# Patient Record
Sex: Male | Born: 1950 | Race: Black or African American | Hispanic: No | Marital: Married | Smoking: Former smoker
Health system: Southern US, Community
[De-identification: ages and names within clinical notes are randomized; demographics above are authoritative.]

## PROBLEM LIST (undated history)

## (undated) DIAGNOSIS — C801 Malignant (primary) neoplasm, unspecified: Secondary | ICD-10-CM

## (undated) DIAGNOSIS — M1990 Unspecified osteoarthritis, unspecified site: Secondary | ICD-10-CM

## (undated) DIAGNOSIS — I10 Essential (primary) hypertension: Secondary | ICD-10-CM

## (undated) DIAGNOSIS — R0600 Dyspnea, unspecified: Secondary | ICD-10-CM

## (undated) DIAGNOSIS — K219 Gastro-esophageal reflux disease without esophagitis: Secondary | ICD-10-CM

## (undated) DIAGNOSIS — G709 Myoneural disorder, unspecified: Secondary | ICD-10-CM

## (undated) DIAGNOSIS — E119 Type 2 diabetes mellitus without complications: Secondary | ICD-10-CM

## (undated) DIAGNOSIS — R112 Nausea with vomiting, unspecified: Secondary | ICD-10-CM

## (undated) DIAGNOSIS — Z87442 Personal history of urinary calculi: Secondary | ICD-10-CM

## (undated) DIAGNOSIS — Z9889 Other specified postprocedural states: Secondary | ICD-10-CM

## (undated) DIAGNOSIS — J45909 Unspecified asthma, uncomplicated: Secondary | ICD-10-CM

## (undated) HISTORY — PX: HERNIA REPAIR: SHX51

## (undated) HISTORY — PX: COLONOSCOPY: SHX174

---

## 2010-04-06 ENCOUNTER — Emergency Department (HOSPITAL_COMMUNITY): Admission: EM | Admit: 2010-04-06 | Discharge: 2010-04-06 | Payer: Self-pay | Admitting: Emergency Medicine

## 2010-12-19 LAB — URINALYSIS, ROUTINE W REFLEX MICROSCOPIC
Bilirubin Urine: NEGATIVE
Glucose, UA: 250 mg/dL — AB
Protein, ur: NEGATIVE mg/dL
Specific Gravity, Urine: 1.01 (ref 1.005–1.030)
pH: 6 (ref 5.0–8.0)

## 2010-12-19 LAB — POCT I-STAT, CHEM 8
BUN: 12 mg/dL (ref 6–23)
Chloride: 105 mEq/L (ref 96–112)
Creatinine, Ser: 1.1 mg/dL (ref 0.4–1.5)
Glucose, Bld: 211 mg/dL — ABNORMAL HIGH (ref 70–99)

## 2010-12-19 LAB — GLUCOSE, CAPILLARY
Glucose-Capillary: 209 mg/dL — ABNORMAL HIGH (ref 70–99)
Glucose-Capillary: 215 mg/dL — ABNORMAL HIGH (ref 70–99)

## 2014-12-15 DIAGNOSIS — N400 Enlarged prostate without lower urinary tract symptoms: Secondary | ICD-10-CM | POA: Diagnosis not present

## 2014-12-15 DIAGNOSIS — E785 Hyperlipidemia, unspecified: Secondary | ICD-10-CM | POA: Diagnosis not present

## 2014-12-15 DIAGNOSIS — E119 Type 2 diabetes mellitus without complications: Secondary | ICD-10-CM | POA: Diagnosis not present

## 2014-12-15 DIAGNOSIS — Z0000 Encounter for general adult medical examination without abnormal findings: Secondary | ICD-10-CM | POA: Diagnosis not present

## 2014-12-15 DIAGNOSIS — E1140 Type 2 diabetes mellitus with diabetic neuropathy, unspecified: Secondary | ICD-10-CM | POA: Diagnosis not present

## 2014-12-15 DIAGNOSIS — Z1212 Encounter for screening for malignant neoplasm of rectum: Secondary | ICD-10-CM | POA: Diagnosis not present

## 2014-12-15 DIAGNOSIS — I10 Essential (primary) hypertension: Secondary | ICD-10-CM | POA: Diagnosis not present

## 2015-03-11 DIAGNOSIS — E119 Type 2 diabetes mellitus without complications: Secondary | ICD-10-CM | POA: Diagnosis not present

## 2015-03-18 DIAGNOSIS — E1140 Type 2 diabetes mellitus with diabetic neuropathy, unspecified: Secondary | ICD-10-CM | POA: Diagnosis not present

## 2015-03-18 DIAGNOSIS — E1165 Type 2 diabetes mellitus with hyperglycemia: Secondary | ICD-10-CM | POA: Diagnosis not present

## 2015-04-23 DIAGNOSIS — Z1211 Encounter for screening for malignant neoplasm of colon: Secondary | ICD-10-CM | POA: Diagnosis not present

## 2015-04-23 DIAGNOSIS — K5900 Constipation, unspecified: Secondary | ICD-10-CM | POA: Diagnosis not present

## 2015-06-17 DIAGNOSIS — E1129 Type 2 diabetes mellitus with other diabetic kidney complication: Secondary | ICD-10-CM | POA: Diagnosis not present

## 2015-06-17 DIAGNOSIS — R809 Proteinuria, unspecified: Secondary | ICD-10-CM | POA: Diagnosis not present

## 2015-06-17 DIAGNOSIS — E1142 Type 2 diabetes mellitus with diabetic polyneuropathy: Secondary | ICD-10-CM | POA: Diagnosis not present

## 2015-06-17 DIAGNOSIS — Z23 Encounter for immunization: Secondary | ICD-10-CM | POA: Diagnosis not present

## 2015-08-12 DIAGNOSIS — E1142 Type 2 diabetes mellitus with diabetic polyneuropathy: Secondary | ICD-10-CM | POA: Diagnosis not present

## 2015-09-16 DIAGNOSIS — E1129 Type 2 diabetes mellitus with other diabetic kidney complication: Secondary | ICD-10-CM | POA: Diagnosis not present

## 2015-09-16 DIAGNOSIS — E1140 Type 2 diabetes mellitus with diabetic neuropathy, unspecified: Secondary | ICD-10-CM | POA: Diagnosis not present

## 2015-09-16 DIAGNOSIS — K047 Periapical abscess without sinus: Secondary | ICD-10-CM | POA: Diagnosis not present

## 2015-09-16 DIAGNOSIS — I10 Essential (primary) hypertension: Secondary | ICD-10-CM | POA: Diagnosis not present

## 2015-12-03 DIAGNOSIS — R05 Cough: Secondary | ICD-10-CM | POA: Diagnosis not present

## 2015-12-03 DIAGNOSIS — M79602 Pain in left arm: Secondary | ICD-10-CM | POA: Diagnosis not present

## 2015-12-03 DIAGNOSIS — E119 Type 2 diabetes mellitus without complications: Secondary | ICD-10-CM | POA: Diagnosis not present

## 2016-01-05 DIAGNOSIS — E1140 Type 2 diabetes mellitus with diabetic neuropathy, unspecified: Secondary | ICD-10-CM | POA: Diagnosis not present

## 2016-01-05 DIAGNOSIS — I10 Essential (primary) hypertension: Secondary | ICD-10-CM | POA: Diagnosis not present

## 2016-01-05 DIAGNOSIS — E1165 Type 2 diabetes mellitus with hyperglycemia: Secondary | ICD-10-CM | POA: Diagnosis not present

## 2016-01-05 DIAGNOSIS — Z125 Encounter for screening for malignant neoplasm of prostate: Secondary | ICD-10-CM | POA: Diagnosis not present

## 2016-01-05 DIAGNOSIS — E7800 Pure hypercholesterolemia, unspecified: Secondary | ICD-10-CM | POA: Diagnosis not present

## 2016-01-05 DIAGNOSIS — Z1389 Encounter for screening for other disorder: Secondary | ICD-10-CM | POA: Diagnosis not present

## 2016-02-18 ENCOUNTER — Emergency Department (HOSPITAL_COMMUNITY)
Admission: EM | Admit: 2016-02-18 | Discharge: 2016-02-18 | Disposition: A | Discharge status: Home / Self Care | Payer: Medicare Other | Attending: Emergency Medicine | Admitting: Emergency Medicine

## 2016-02-18 ENCOUNTER — Emergency Department (HOSPITAL_COMMUNITY): Payer: Medicare Other

## 2016-02-18 ENCOUNTER — Encounter (HOSPITAL_COMMUNITY): Payer: Self-pay | Admitting: Emergency Medicine

## 2016-02-18 DIAGNOSIS — Z7984 Long term (current) use of oral hypoglycemic drugs: Secondary | ICD-10-CM | POA: Diagnosis not present

## 2016-02-18 DIAGNOSIS — N201 Calculus of ureter: Secondary | ICD-10-CM | POA: Diagnosis not present

## 2016-02-18 DIAGNOSIS — E119 Type 2 diabetes mellitus without complications: Secondary | ICD-10-CM | POA: Diagnosis not present

## 2016-02-18 DIAGNOSIS — Z794 Long term (current) use of insulin: Secondary | ICD-10-CM | POA: Insufficient documentation

## 2016-02-18 DIAGNOSIS — N200 Calculus of kidney: Secondary | ICD-10-CM

## 2016-02-18 DIAGNOSIS — N132 Hydronephrosis with renal and ureteral calculous obstruction: Secondary | ICD-10-CM | POA: Diagnosis not present

## 2016-02-18 DIAGNOSIS — R339 Retention of urine, unspecified: Secondary | ICD-10-CM | POA: Diagnosis present

## 2016-02-18 DIAGNOSIS — N202 Calculus of kidney with calculus of ureter: Secondary | ICD-10-CM | POA: Diagnosis not present

## 2016-02-18 HISTORY — DX: Type 2 diabetes mellitus without complications: E11.9

## 2016-02-18 LAB — COMPREHENSIVE METABOLIC PANEL
ALT: 36 U/L (ref 17–63)
ANION GAP: 7 (ref 5–15)
AST: 32 U/L (ref 15–41)
Albumin: 4.3 g/dL (ref 3.5–5.0)
Alkaline Phosphatase: 47 U/L (ref 38–126)
BUN: 18 mg/dL (ref 6–20)
CHLORIDE: 105 mmol/L (ref 101–111)
CO2: 28 mmol/L (ref 22–32)
CREATININE: 1.47 mg/dL — AB (ref 0.61–1.24)
Calcium: 9.7 mg/dL (ref 8.9–10.3)
GFR, EST AFRICAN AMERICAN: 56 mL/min — AB (ref >60)
GFR, EST NON AFRICAN AMERICAN: 48 mL/min — AB (ref >60)
Glucose, Bld: 159 mg/dL — ABNORMAL HIGH (ref 65–99)
POTASSIUM: 4 mmol/L (ref 3.5–5.1)
SODIUM: 140 mmol/L (ref 135–145)
Total Bilirubin: 0.8 mg/dL (ref 0.3–1.2)
Total Protein: 7.4 g/dL (ref 6.5–8.1)

## 2016-02-18 LAB — CBC WITH DIFFERENTIAL/PLATELET
Basophils Absolute: 0 10*3/uL (ref 0.0–0.1)
Basophils Relative: 0 %
EOS ABS: 0 10*3/uL (ref 0.0–0.7)
Eosinophils Relative: 0 %
HEMATOCRIT: 41 % (ref 39.0–52.0)
HEMOGLOBIN: 13.9 g/dL (ref 13.0–17.0)
LYMPHS ABS: 1.2 10*3/uL (ref 0.7–4.0)
Lymphocytes Relative: 10 %
MCH: 29.4 pg (ref 26.0–34.0)
MCHC: 33.9 g/dL (ref 30.0–36.0)
MCV: 86.9 fL (ref 78.0–100.0)
MONOS PCT: 6 %
Monocytes Absolute: 0.7 10*3/uL (ref 0.1–1.0)
NEUTROS ABS: 9.2 10*3/uL — AB (ref 1.7–7.7)
NEUTROS PCT: 84 %
PLATELETS: 185 10*3/uL (ref 150–400)
RBC: 4.72 MIL/uL (ref 4.22–5.81)
RDW: 13.3 % (ref 11.5–15.5)
WBC: 11.1 10*3/uL — AB (ref 4.0–10.5)

## 2016-02-18 LAB — URINE MICROSCOPIC-ADD ON

## 2016-02-18 LAB — URINALYSIS, ROUTINE W REFLEX MICROSCOPIC
Bilirubin Urine: NEGATIVE
GLUCOSE, UA: 100 mg/dL — AB
Hgb urine dipstick: NEGATIVE
KETONES UR: NEGATIVE mg/dL
LEUKOCYTES UA: NEGATIVE
NITRITE: NEGATIVE
PH: 8 (ref 5.0–8.0)
Protein, ur: 100 mg/dL — AB
SPECIFIC GRAVITY, URINE: 1.025 (ref 1.005–1.030)

## 2016-02-18 MED ORDER — OXYCODONE-ACETAMINOPHEN 5-325 MG PO TABS: 1.0000 | ORAL_TABLET | ORAL | Status: DC | PRN

## 2016-02-18 MED ORDER — OXYCODONE-ACETAMINOPHEN 5-325 MG PO TABS
2.0000 | ORAL_TABLET | Freq: Once | ORAL | Status: AC
  Administered 2016-02-18: 2 via ORAL
  Filled 2016-02-18: qty 2

## 2016-02-18 MED ORDER — CIPROFLOXACIN HCL 500 MG PO TABS
500.0000 mg | ORAL_TABLET | Freq: Once | ORAL | Status: AC
  Administered 2016-02-18: 500 mg via ORAL
  Filled 2016-02-18: qty 1

## 2016-02-18 MED ORDER — TAMSULOSIN HCL 0.4 MG PO CAPS: 0.4000 mg | ORAL_CAPSULE | Freq: Every day | ORAL | Status: DC

## 2016-02-18 MED ORDER — CIPROFLOXACIN HCL 500 MG PO TABS: 500.0000 mg | ORAL_TABLET | Freq: Two times a day (BID) | ORAL | Status: DC

## 2016-02-18 MED ORDER — TAMSULOSIN HCL 0.4 MG PO CAPS
0.4000 mg | ORAL_CAPSULE | Freq: Once | ORAL | Status: AC
  Administered 2016-02-18: 0.4 mg via ORAL
  Filled 2016-02-18: qty 1

## 2016-02-18 NOTE — ED Provider Notes (Signed)
CSN: [id number]     Arrival date & time 02/18/16  1520 History   First MD Initiated Contact with Patient 02/18/16 1730     Chief Complaint  Patient presents with  . Urinary Retention     (Consider location/radiation/quality/duration/timing/severity/associated sxs/prior Treatment) HPI Patient reports at approximately 1 PM he was unable to urinate. He reports he developed pain in his left flank and urgency to urinate but could not. He denies he's had this problem before. He states he knew his prostate was somewhat enlarged but has never had to have a catheter before. No recent fever chills or pain or burning with urination. Patient had a urinary catheter placed prior to my evaluation. He rated his initial pain at 8 out of 10 and then after the catheter 4 out of 10. Past Medical History  Diagnosis Date  . Diabetes mellitus without complication ([place])    History reviewed. No pertinent past surgical history. History reviewed. No pertinent family history. Social History  Substance Use Topics  . Smoking status: Never Smoker   . Smokeless tobacco: None  . Alcohol Use: No    Review of Systems  10 Systems reviewed and are negative for acute change except as noted in the HPI.   Allergies  Review of patient's allergies indicates no known allergies.  Home Medications   Prior to Admission medications   Medication Sig Start Date End Date Taking? Authorizing Provider  gabapentin (NEURONTIN) 800 MG tablet Take 800 mg by mouth 3 (three) times daily.   Yes Historical Provider, MD  insulin aspart (NOVOLOG FLEXPEN) 100 UNIT/ML FlexPen Inject 5 Units into the skin 3 (three) times daily with meals.   Yes Historical Provider, MD  LANTUS SOLOSTAR 100 UNIT/ML Solostar Pen INJECT 50-60 UNITS UNDER SKIN TWICE DAILY FOR DIABETES INCREASE DOSE TILL FBS IS 100-130 01/06/16  Yes Historical Provider, MD  lisinopril (PRINIVIL,ZESTRIL) 20 MG tablet Take 20 mg by mouth daily. 12/07/15  Yes Historical Provider, MD   metFORMIN (GLUCOPHAGE) 1000 MG tablet Take 1,000 mg by mouth 2 (two) times daily. 01/07/16  Yes Historical Provider, MD  omeprazole (PRILOSEC) 40 MG capsule Take 40 mg by mouth daily. 01/04/16  Yes Historical Provider, MD  ciprofloxacin (CIPRO) 500 MG tablet Take 1 tablet (500 mg total) by mouth 2 (two) times daily. One po bid x 7 days 02/18/16   Charlesetta Shanks, MD  oxyCODONE-acetaminophen (PERCOCET) 5-325 MG tablet Take 1-2 tablets by mouth every 4 (four) hours as needed. 02/18/16   Charlesetta Shanks, MD  tamsulosin (FLOMAX) 0.4 MG CAPS capsule Take 1 capsule (0.4 mg total) by mouth daily. 02/18/16   Charlesetta Shanks, MD   BP 157/91 mmHg  Pulse 86  Temp(Src) 98.9 F (37.2 C) (Oral)  Resp 19  SpO2 97% Physical Exam  Constitutional: He is oriented to person, place, and time. He appears well-developed and well-nourished.  HENT:  Head: Normocephalic and atraumatic.  Eyes: EOM are normal.  Cardiovascular: Normal rate, regular rhythm, normal heart sounds and intact distal pulses.   Pulmonary/Chest: Effort normal and breath sounds normal.  Abdominal: Soft. He exhibits distension. There is no tenderness.  No CVA tenderness. Patient has central obesity and distended abdomen but it is nontender. Catheter was placed prior to my evaluation.  Musculoskeletal: Normal range of motion. He exhibits no edema or tenderness.  Neurological: He is alert and oriented to person, place, and time. Coordination normal.  Skin: Skin is warm and dry.  Psychiatric: He has a normal mood and affect.  ED Course  Procedures (including critical care time) Labs Review Labs Reviewed  URINALYSIS, ROUTINE W REFLEX MICROSCOPIC (NOT AT [facility]) - Abnormal; Notable for the following:    APPearance CLOUDY (*)    Glucose, UA 100 (*)    Protein, ur 100 (*)    All other components within normal limits  URINE MICROSCOPIC-ADD ON - Abnormal; Notable for the following:    Squamous Epithelial / LPF 0-5 (*)    Bacteria, UA RARE (*)     Casts GRANULAR CAST (*)    All other components within normal limits  COMPREHENSIVE METABOLIC PANEL - Abnormal; Notable for the following:    Glucose, Bld 159 (*)    Creatinine, Ser 1.47 (*)    GFR calc non Af Amer 48 (*)    GFR calc Af Amer 56 (*)    All other components within normal limits  CBC WITH DIFFERENTIAL/PLATELET - Abnormal; Notable for the following:    WBC 11.1 (*)    Neutro Abs 9.2 (*)    All other components within normal limits    Imaging Review Ct Renal Stone Study  02/18/2016  CLINICAL DATA:  65 year old male stop with left flank pain. EXAM: CT ABDOMEN AND PELVIS WITHOUT CONTRAST TECHNIQUE: Multidetector CT imaging of the abdomen and pelvis was performed following the standard protocol without IV contrast. COMPARISON:  None. FINDINGS: Evaluation of this exam is limited in the absence of intravenous contrast. The visualized lung bases are clear. New no intra-abdominal free air or free fluid. The liver, gallbladder, pancreas, spleen, and the adrenal glands appear unremarkable. There is a 2 mm calculus in the distal left ureter adjacent the left UVJ with mild left hydronephrosis. Mild left perinephric stranding noted which may represent edema or related to urine extravasation. No collection. The right kidney, right ureter appear unremarkable. The urinary bladder is decompressed around a Foley catheter. There is diffuse thickening of the bladder wall which may be partly related to underdistention. Cystitis is not excluded. Correlation with urinalysis recommended. The prostate and seminal vesicles are grossly unremarkable. There is moderate stool throughout the colon. No evidence of bowel obstruction or active inflammation. Normal appendix. There is mild aortoiliac atherosclerotic disease. Evaluation of the vasculature is limited on this noncontrast study. There is a retro aortic left renal vein anatomy. No portal venous gas identified. There is no adenopathy. Small bilateral fat  containing inguinal hernias. The abdominal wall soft tissues are otherwise unremarkable. There is degenerative changes of the spine. A Schmorl's node seen at the inferior endplate of the L4 vertebrae. There is disc desiccation with vacuum phenomena at L4-5. No acute fracture. IMPRESSION: A 2 mm distal left ureteral calculus with mild left hydronephrosis. Correlation with urinalysis recommended to exclude superimposed UTI. Electronically Signed   By: Anner Crete M.D.   On: 02/18/2016 21:31   I have personally reviewed and evaluated these images and lab results as part of my medical decision-making.   EKG Interpretation None      MDM   Final diagnoses:  Kidney stone on left side   Immediate Foley output was approximately 250 mL. Total output throughout stay was 450 mL. Findings were not consistent with urinary retention and patient's pain had not alleviated completely as would be typical. CT scan was pursued and patient was found to have a 2 mm left ureteral stone. He is nontoxic and alert. He is afebrile. She has not had vomiting. At this time he will be started on Percocet and Flomax with empiric  ciprofloxacin. Instructions are to follow up with his primary care provider and strain his urine. He is advised to return should he develop worsening pain, fever or other concerning symptoms. Foley catheter was discontinued as this did not appear to be a case of urinary retention.    Charlesetta Shanks, MD 02/18/16 2245

## 2016-02-18 NOTE — ED Notes (Signed)
Pt reports L flank pain and urge to urinate. Last able to urinate at 1230.

## 2016-02-18 NOTE — Discharge Instructions (Signed)
Kidney Stones °Kidney stones (urolithiasis) are deposits that form inside your kidneys. The intense pain is caused by the stone moving through the urinary tract. When the stone moves, the ureter goes into spasm around the stone. The stone is usually passed in the urine.  °CAUSES  °· A disorder that makes certain neck glands produce too much parathyroid hormone (primary hyperparathyroidism). °· A buildup of uric acid crystals, similar to gout in your joints. °· Narrowing (stricture) of the ureter. °· A kidney obstruction present at birth (congenital obstruction). °· Previous surgery on the kidney or ureters. °· Numerous kidney infections. °SYMPTOMS  °· Feeling sick to your stomach (nauseous). °· Throwing up (vomiting). °· Blood in the urine (hematuria). °· Pain that usually spreads (radiates) to the groin. °· Frequency or urgency of urination. °DIAGNOSIS  °· Taking a history and physical exam. °· Blood or urine tests. °· CT scan. °· Occasionally, an examination of the inside of the urinary bladder (cystoscopy) is performed. °TREATMENT  °· Observation. °· Increasing your fluid intake. °· Extracorporeal shock wave lithotripsy--This is a noninvasive procedure that uses shock waves to break up kidney stones. °· Surgery may be needed if you have severe pain or persistent obstruction. There are various surgical procedures. Most of the procedures are performed with the use of small instruments. Only small incisions are needed to accommodate these instruments, so recovery time is minimized. °The size, location, and chemical composition are all important variables that will determine the proper choice of action for you. Talk to your health care provider to better understand your situation so that you will minimize the risk of injury to yourself and your kidney.  °HOME CARE INSTRUCTIONS  °· Drink enough water and fluids to keep your urine clear or pale yellow. This will help you to pass the stone or stone fragments. °· Strain  all urine through the provided strainer. Keep all particulate matter and stones for your health care provider to see. The stone causing the pain may be as small as a grain of salt. It is very important to use the strainer each and every time you pass your urine. The collection of your stone will allow your health care provider to analyze it and verify that a stone has actually passed. The stone analysis will often identify what you can do to reduce the incidence of recurrences. °· Only take over-the-counter or prescription medicines for pain, discomfort, or fever as directed by your health care provider. °· Keep all follow-up visits as told by your health care provider. This is important. °· Get follow-up X-rays if required. The absence of pain does not always mean that the stone has passed. It may have only stopped moving. If the urine remains completely obstructed, it can cause loss of kidney function or even complete destruction of the kidney. It is your responsibility to make sure X-rays and follow-ups are completed. Ultrasounds of the kidney can show blockages and the status of the kidney. Ultrasounds are not associated with any radiation and can be performed easily in a matter of minutes. °· Make changes to your daily diet as told by your health care provider. You may be told to: °¨ Limit the amount of salt that you eat. °¨ Eat 5 or more servings of fruits and vegetables each day. °¨ Limit the amount of meat, poultry, fish, and eggs that you eat. °· Collect a 24-hour urine sample as told by your health care provider. You may need to collect another urine sample every 6-12   months. °SEEK MEDICAL CARE IF: °· You experience pain that is progressive and unresponsive to any pain medicine you have been prescribed. °SEEK IMMEDIATE MEDICAL CARE IF:  °· Pain cannot be controlled with the prescribed medicine. °· You have a fever or shaking chills. °· The severity or intensity of pain increases over 18 hours and is not  relieved by pain medicine. °· You develop a new onset of abdominal pain. °· You feel faint or pass out. °· You are unable to urinate. °  °This information is not intended to replace advice given to you by your health care provider. Make sure you discuss any questions you have with your health care provider. °  °Document Released: 09/19/2005 Document Revised: 06/10/2015 Document Reviewed: 02/20/2013 °[employer] Interactive Patient Education ©2016 [employer] ° °

## 2016-04-07 DIAGNOSIS — S199XXA Unspecified injury of neck, initial encounter: Secondary | ICD-10-CM | POA: Diagnosis not present

## 2016-04-07 DIAGNOSIS — S29019A Strain of muscle and tendon of unspecified wall of thorax, initial encounter: Secondary | ICD-10-CM | POA: Diagnosis not present

## 2016-04-07 DIAGNOSIS — S299XXA Unspecified injury of thorax, initial encounter: Secondary | ICD-10-CM | POA: Diagnosis not present

## 2016-04-07 DIAGNOSIS — M546 Pain in thoracic spine: Secondary | ICD-10-CM | POA: Diagnosis not present

## 2016-04-07 DIAGNOSIS — S161XXA Strain of muscle, fascia and tendon at neck level, initial encounter: Secondary | ICD-10-CM | POA: Diagnosis not present

## 2016-04-07 DIAGNOSIS — M542 Cervicalgia: Secondary | ICD-10-CM | POA: Diagnosis not present

## 2016-04-08 DIAGNOSIS — M546 Pain in thoracic spine: Secondary | ICD-10-CM | POA: Diagnosis not present

## 2016-04-08 DIAGNOSIS — S299XXA Unspecified injury of thorax, initial encounter: Secondary | ICD-10-CM | POA: Diagnosis not present

## 2016-04-08 DIAGNOSIS — S199XXA Unspecified injury of neck, initial encounter: Secondary | ICD-10-CM | POA: Diagnosis not present

## 2016-04-08 DIAGNOSIS — M542 Cervicalgia: Secondary | ICD-10-CM | POA: Diagnosis not present

## 2016-05-04 DIAGNOSIS — Z9181 History of falling: Secondary | ICD-10-CM | POA: Diagnosis not present

## 2016-05-04 DIAGNOSIS — Z0000 Encounter for general adult medical examination without abnormal findings: Secondary | ICD-10-CM | POA: Diagnosis not present

## 2016-05-04 DIAGNOSIS — R972 Elevated prostate specific antigen [PSA]: Secondary | ICD-10-CM | POA: Diagnosis not present

## 2016-05-04 DIAGNOSIS — E1165 Type 2 diabetes mellitus with hyperglycemia: Secondary | ICD-10-CM | POA: Diagnosis not present

## 2016-05-04 DIAGNOSIS — I10 Essential (primary) hypertension: Secondary | ICD-10-CM | POA: Diagnosis not present

## 2016-05-04 DIAGNOSIS — E7800 Pure hypercholesterolemia, unspecified: Secondary | ICD-10-CM | POA: Diagnosis not present

## 2016-09-12 DIAGNOSIS — R972 Elevated prostate specific antigen [PSA]: Secondary | ICD-10-CM | POA: Diagnosis not present

## 2016-09-12 DIAGNOSIS — I10 Essential (primary) hypertension: Secondary | ICD-10-CM | POA: Diagnosis not present

## 2016-09-12 DIAGNOSIS — E7800 Pure hypercholesterolemia, unspecified: Secondary | ICD-10-CM | POA: Diagnosis not present

## 2016-09-12 DIAGNOSIS — E1165 Type 2 diabetes mellitus with hyperglycemia: Secondary | ICD-10-CM | POA: Diagnosis not present

## 2016-09-12 DIAGNOSIS — Z23 Encounter for immunization: Secondary | ICD-10-CM | POA: Diagnosis not present

## 2016-10-06 DIAGNOSIS — N401 Enlarged prostate with lower urinary tract symptoms: Secondary | ICD-10-CM | POA: Diagnosis not present

## 2016-10-06 DIAGNOSIS — Z8042 Family history of malignant neoplasm of prostate: Secondary | ICD-10-CM | POA: Diagnosis not present

## 2016-10-06 DIAGNOSIS — R972 Elevated prostate specific antigen [PSA]: Secondary | ICD-10-CM | POA: Diagnosis not present

## 2016-10-25 DIAGNOSIS — R972 Elevated prostate specific antigen [PSA]: Secondary | ICD-10-CM | POA: Diagnosis not present

## 2016-10-25 DIAGNOSIS — C61 Malignant neoplasm of prostate: Secondary | ICD-10-CM | POA: Diagnosis not present

## 2016-10-25 DIAGNOSIS — Z8042 Family history of malignant neoplasm of prostate: Secondary | ICD-10-CM | POA: Diagnosis not present

## 2016-10-25 DIAGNOSIS — N401 Enlarged prostate with lower urinary tract symptoms: Secondary | ICD-10-CM | POA: Diagnosis not present

## 2016-11-01 DIAGNOSIS — C61 Malignant neoplasm of prostate: Secondary | ICD-10-CM | POA: Diagnosis not present

## 2016-11-07 DIAGNOSIS — D1803 Hemangioma of intra-abdominal structures: Secondary | ICD-10-CM | POA: Diagnosis not present

## 2016-11-07 DIAGNOSIS — R911 Solitary pulmonary nodule: Secondary | ICD-10-CM | POA: Diagnosis not present

## 2016-11-07 DIAGNOSIS — K4020 Bilateral inguinal hernia, without obstruction or gangrene, not specified as recurrent: Secondary | ICD-10-CM | POA: Diagnosis not present

## 2016-11-07 DIAGNOSIS — C61 Malignant neoplasm of prostate: Secondary | ICD-10-CM | POA: Diagnosis not present

## 2016-11-07 DIAGNOSIS — I2510 Atherosclerotic heart disease of native coronary artery without angina pectoris: Secondary | ICD-10-CM | POA: Diagnosis not present

## 2016-11-11 DIAGNOSIS — R05 Cough: Secondary | ICD-10-CM | POA: Diagnosis not present

## 2016-11-22 DIAGNOSIS — C61 Malignant neoplasm of prostate: Secondary | ICD-10-CM | POA: Diagnosis not present

## 2016-12-10 DIAGNOSIS — J209 Acute bronchitis, unspecified: Secondary | ICD-10-CM | POA: Diagnosis not present

## 2016-12-10 DIAGNOSIS — R05 Cough: Secondary | ICD-10-CM | POA: Diagnosis not present

## 2016-12-10 DIAGNOSIS — R0602 Shortness of breath: Secondary | ICD-10-CM | POA: Diagnosis not present

## 2016-12-10 DIAGNOSIS — J440 Chronic obstructive pulmonary disease with acute lower respiratory infection: Secondary | ICD-10-CM | POA: Diagnosis not present

## 2016-12-13 DIAGNOSIS — C61 Malignant neoplasm of prostate: Secondary | ICD-10-CM | POA: Diagnosis not present

## 2016-12-14 ENCOUNTER — Other Ambulatory Visit: Payer: Self-pay | Admitting: Urology

## 2016-12-20 DIAGNOSIS — C61 Malignant neoplasm of prostate: Secondary | ICD-10-CM | POA: Diagnosis not present

## 2016-12-20 DIAGNOSIS — R278 Other lack of coordination: Secondary | ICD-10-CM | POA: Diagnosis not present

## 2016-12-20 DIAGNOSIS — M6281 Muscle weakness (generalized): Secondary | ICD-10-CM | POA: Diagnosis not present

## 2016-12-27 DIAGNOSIS — R319 Hematuria, unspecified: Secondary | ICD-10-CM | POA: Diagnosis not present

## 2016-12-27 DIAGNOSIS — N1330 Unspecified hydronephrosis: Secondary | ICD-10-CM | POA: Diagnosis not present

## 2016-12-27 DIAGNOSIS — R911 Solitary pulmonary nodule: Secondary | ICD-10-CM | POA: Diagnosis not present

## 2016-12-27 DIAGNOSIS — N23 Unspecified renal colic: Secondary | ICD-10-CM | POA: Diagnosis not present

## 2016-12-29 DIAGNOSIS — C61 Malignant neoplasm of prostate: Secondary | ICD-10-CM | POA: Diagnosis not present

## 2016-12-29 DIAGNOSIS — M6281 Muscle weakness (generalized): Secondary | ICD-10-CM | POA: Diagnosis not present

## 2017-01-05 NOTE — Patient Instructions (Addendum)
Joe Thornton  01/05/2017   Your procedure is scheduled on: 01/12/17  Report to [facility] Main  Entrance take [place]  elevators to 3rd floor to  [place] at    [place] AM.  Call this number if you have problems the morning of surgery [phone]   Remember: ONLY 1 PERSON MAY GO WITH YOU TO SHORT STAY TO GET  READY MORNING OF [name].  Do not eat food or drink liquids :After Midnight.     Take these medicines the morning of surgery with A SIP OF WATER: omeprazole  DO NOT TAKE ANY ORAL DIABETIC MEDICATIONS DAY OF YOUR SURGERY                 How to Manage Your Diabetes               Before and After Surgery  Why is it important to control my blood sugar before and after surgery? . Improving blood sugar levels before and after surgery helps healing and can limit problems. . A way of improving blood sugar control is eating a healthy diet by: o  Eating less sugar and carbohydrates o  Increasing activity/exercise o  Talking with your doctor about reaching your blood sugar goals . High blood sugars (greater than 180 mg/dL) can raise your risk of infections and slow your recovery, so you will need to focus on controlling your diabetes during the weeks before surgery. . Make sure that the doctor who takes care of your diabetes knows about your planned surgery including the date and location.  How do I manage my blood sugar before surgery? . Check your blood sugar at least 4 times a day, starting 2 days before surgery, to make sure that the level is not too high or low. o Check your blood sugar the morning of your surgery when you wake up and every 2 hours until you get to the Short Stay unit. . If your blood sugar is less than 70 mg/dL, you will need to treat for low blood sugar: o Do not take insulin. o Treat a low blood sugar (less than 70 mg/dL) with  cup of clear juice (cranberry or apple), 4 glucose tablets, OR glucose gel. o Recheck blood sugar in  15 minutes after treatment (to make sure it is greater than 70 mg/dL). If your blood sugar is not greater than 70 mg/dL on recheck, call [phone] for further instructions. . Report your blood sugar to the short stay nurse when you get to Short Stay.  . If you are admitted to the hospital after surgery: o Your blood sugar will be checked by the staff and you will probably be given insulin after surgery (instead of oral diabetes medicines) to make sure you have good blood sugar levels. o The goal for blood sugar control after surgery is 80-180 mg/dL.   WHAT DO I DO ABOUT MY DIABETES MEDICATION?  Marland Kitchen Do not take oral diabetes medicines (pills) the morning of surgery.  . THE NIGHT BEFORE SURGERY, take  50%   of     Normal Lantus   Insulin dose           Novolog take as usual before meals . NO bedtime dose  . THE MORNING OF SURGERY, take   50%  of Lantus   Insulin dose  . The day of surgery, do not take other  diabetes injectables, including Byetta (exenatide), Bydureon (exenatide ER), Victoza (liraglutide), or Trulicity (dulaglutide).  . If your CBG is greater than 220 mg/dL, you may take  of your sliding scale  . (correction) dose of insulin.     Patient Signature:  Date:   Nurse Signature:  Date:                You may not have any metal on your body including hair pins and              piercings  Do not wear jewelry,lotions, powders or perfumes, deodorant                         Men may shave face and neck.   Do not bring valuables to the hospital. [place].  Contacts, dentures or bridgework may not be worn into surgery.  Leave suitcase in the car. After surgery it may be brought to your room.              [place] - Preparing for Surgery Before surgery, you can play an important role.  Because skin is not sterile, your skin needs to be as free of germs as possible.  You can reduce the number of germs on your skin by  washing with CHG (chlorahexidine gluconate) soap before surgery.  CHG is an antiseptic cleaner which kills germs and bonds with the skin to continue killing germs even after washing. Please DO NOT use if you have an allergy to CHG or antibacterial soaps.  If your skin becomes reddened/irritated stop using the CHG and inform your nurse when you arrive at Short Stay. Do not shave (including legs and underarms) for at least 48 hours prior to the first CHG shower.  You may shave your face/neck. Please follow these instructions carefully:  1.  Shower with CHG Soap the night before surgery and the  morning of Surgery.  2.  If you choose to wash your hair, wash your hair first as usual with your  normal  shampoo.  3.  After you shampoo, rinse your hair and body thoroughly to remove the  shampoo.                           4.  Use CHG as you would any other liquid soap.  You can apply chg directly  to the skin and wash                       Gently with a scrungie or clean washcloth.  5.  Apply the CHG Soap to your body ONLY FROM THE NECK DOWN.   Do not use on face/ open                           Wound or open sores. Avoid contact with eyes, ears mouth and genitals (private parts).                       Wash face,  Genitals (private parts) with your normal soap.             6.  Wash thoroughly, paying special attention to the area where your surgery  will be performed.  7.  Thoroughly rinse  your body with warm water from the neck down.  8.  DO NOT shower/wash with your normal soap after using and rinsing off  the CHG Soap.                9.  Pat yourself dry with a clean towel.            10.  Wear clean pajamas.            11.  Place clean sheets on your bed the night of your first shower and do not  sleep with pets. Day of Surgery : Do not apply any lotions/deodorants the morning of surgery.  Please wear clean clothes to the hospital/surgery center.  FAILURE TO FOLLOW THESE INSTRUCTIONS MAY RESULT IN THE  CANCELLATION OF YOUR SURGERY PATIENT SIGNATURE_________________________________  NURSE SIGNATURE__________________________________  ________________________________________________________________________  WHAT IS A BLOOD TRANSFUSION? Blood Transfusion Information  A transfusion is the replacement of blood or some of its parts. Blood is made up of multiple cells which provide different functions.  Red blood cells carry oxygen and are used for blood loss replacement.  White blood cells fight against infection.  Platelets control bleeding.  Plasma helps clot blood.  Other blood products are available for specialized needs, such as hemophilia or other clotting disorders. BEFORE THE TRANSFUSION  Who gives blood for transfusions?   Healthy volunteers who are fully evaluated to make sure their blood is safe. This is blood bank blood. Transfusion therapy is the safest it has ever been in the practice of medicine. Before blood is taken from a donor, a complete history is taken to make sure that person has no history of diseases nor engages in risky social behavior (examples are intravenous drug use or sexual activity with multiple partners). The donor's travel history is screened to minimize risk of transmitting infections, such as malaria. The donated blood is tested for signs of infectious diseases, such as HIV and hepatitis. The blood is then tested to be sure it is compatible with you in order to minimize the chance of a transfusion reaction. If you or a relative donates blood, this is often done in anticipation of surgery and is not appropriate for emergency situations. It takes many days to process the donated blood. RISKS AND COMPLICATIONS Although transfusion therapy is very safe and saves many lives, the main dangers of transfusion include:   Getting an infectious disease.  Developing a transfusion reaction. This is an allergic reaction to something in the blood you were given. Every  precaution is taken to prevent this. The decision to have a blood transfusion has been considered carefully by your caregiver before blood is given. Blood is not given unless the benefits outweigh the risks. AFTER THE TRANSFUSION  Right after receiving a blood transfusion, you will usually feel much better and more energetic. This is especially true if your red blood cells have gotten low (anemic). The transfusion raises the level of the red blood cells which carry oxygen, and this usually causes an energy increase.  The nurse administering the transfusion will monitor you carefully for complications. HOME CARE INSTRUCTIONS  No special instructions are needed after a transfusion. You may find your energy is better. Speak with your caregiver about any limitations on activity for underlying diseases you may have. SEEK MEDICAL CARE IF:   Your condition is not improving after your transfusion.  You develop redness or irritation at the intravenous (IV) site. SEEK IMMEDIATE MEDICAL CARE IF:  Any of the following  symptoms occur over the next 12 hours:  Shaking chills.  You have a temperature by mouth above 102 F (38.9 C), not controlled by medicine.  Chest, back, or muscle pain.  People around you feel you are not acting correctly or are confused.  Shortness of breath or difficulty breathing.  Dizziness and fainting.  You get a rash or develop hives.  You have a decrease in urine output.  Your urine turns a dark color or changes to pink, red, or brown. Any of the following symptoms occur over the next 10 days:  You have a temperature by mouth above 102 F (38.9 C), not controlled by medicine.  Shortness of breath.  Weakness after normal activity.  The white part of the eye turns yellow (jaundice).  You have a decrease in the amount of urine or are urinating less often.  Your urine turns a dark color or changes to pink, red, or brown. Document Released: 09/16/2000 Document  Revised: 12/12/2011 Document Reviewed: 05/05/2008 ExitCare Patient Information 2014 [place].  _______________________________________________________________________  Incentive Spirometer  An incentive spirometer is a tool that can help keep your lungs clear and active. This tool measures how well you are filling your lungs with each breath. Taking long deep breaths may help reverse or decrease the chance of developing breathing (pulmonary) problems (especially infection) following:  A long period of time when you are unable to move or be active. BEFORE THE PROCEDURE   If the spirometer includes an indicator to show your best effort, your nurse or respiratory therapist will set it to a desired goal.  If possible, sit up straight or lean slightly forward. Try not to slouch.  Hold the incentive spirometer in an upright position. INSTRUCTIONS FOR USE  1. Sit on the edge of your bed if possible, or sit up as far as you can in bed or on a chair. 2. Hold the incentive spirometer in an upright position. 3. Breathe out normally. 4. Place the mouthpiece in your mouth and seal your lips tightly around it. 5. Breathe in slowly and as deeply as possible, raising the piston or the ball toward the top of the column. 6. Hold your breath for 3-5 seconds or for as long as possible. Allow the piston or ball to fall to the bottom of the column. 7. Remove the mouthpiece from your mouth and breathe out normally. 8. Rest for a few seconds and repeat Steps 1 through 7 at least 10 times every 1-2 hours when you are awake. Take your time and take a few normal breaths between deep breaths. 9. The spirometer may include an indicator to show your best effort. Use the indicator as a goal to work toward during each repetition. 10. After each set of 10 deep breaths, practice coughing to be sure your lungs are clear. If you have an incision (the cut made at the time of surgery), support your incision when  coughing by placing a pillow or rolled up towels firmly against it. Once you are able to get out of bed, walk around indoors and cough well. You may stop using the incentive spirometer when instructed by your caregiver.  RISKS AND COMPLICATIONS  Take your time so you do not get dizzy or light-headed.  If you are in pain, you may need to take or ask for pain medication before doing incentive spirometry. It is harder to take a deep breath if you are having pain. AFTER USE  Rest and breathe slowly and easily.  It can be helpful to keep track of a log of your progress. Your caregiver can provide you with a simple table to help with this. If you are using the spirometer at home, follow these instructions: [place] IF:   You are having difficultly using the spirometer.  You have trouble using the spirometer as often as instructed.  Your pain medication is not giving enough relief while using the spirometer.  You develop fever of 100.5 F (38.1 C) or higher. SEEK IMMEDIATE MEDICAL CARE IF:   You cough up bloody sputum that had not been present before.  You develop fever of 102 F (38.9 C) or greater.  You develop worsening pain at or near the incision site. MAKE SURE YOU:   Understand these instructions.  Will watch your condition.  Will get help right away if you are not doing well or get worse. Document Released: 01/30/2007 Document Revised: 12/12/2011 Document Reviewed: 04/02/2007 ExitCare Patient Information 2014 ExitCare, [place].   ________________________________________________________________________    CLEAR LIQUID DIET   Foods Allowed                                                                     Foods Excluded  Coffee and tea, regular and decaf                             liquids that you cannot  Plain Jell-O in any flavor                                             see through such as: Fruit ices (not with fruit pulp)                                      milk, soups, orange juice  Iced Popsicles                                    All solid food Carbonated beverages, regular and diet                                    Cranberry, grape and apple juices Sports drinks like Gatorade Lightly seasoned clear broth or consume(fat free) Sugar, honey syrup  Sample Menu Breakfast                                Lunch                                     Supper Cranberry juice                    Beef broth  Chicken broth Jell-O                                     Grape juice                           Apple juice Coffee or tea                        Jell-O                                      Popsicle                                                Coffee or tea                        Coffee or tea  _____________________________________________________________________

## 2017-01-09 ENCOUNTER — Encounter (HOSPITAL_COMMUNITY)
Admission: RE | Admit: 2017-01-09 | Discharge: 2017-01-09 | Disposition: A | Discharge status: Home / Self Care | Payer: Medicare Other | Source: Ambulatory Visit | Attending: Urology | Admitting: Urology

## 2017-01-09 ENCOUNTER — Encounter (INDEPENDENT_AMBULATORY_CARE_PROVIDER_SITE_OTHER): Payer: Self-pay

## 2017-01-09 ENCOUNTER — Encounter (HOSPITAL_COMMUNITY): Payer: Self-pay

## 2017-01-09 DIAGNOSIS — E7800 Pure hypercholesterolemia, unspecified: Secondary | ICD-10-CM | POA: Diagnosis not present

## 2017-01-09 DIAGNOSIS — Z01810 Encounter for preprocedural cardiovascular examination: Secondary | ICD-10-CM

## 2017-01-09 DIAGNOSIS — I10 Essential (primary) hypertension: Secondary | ICD-10-CM | POA: Diagnosis not present

## 2017-01-09 DIAGNOSIS — M1990 Unspecified osteoarthritis, unspecified site: Secondary | ICD-10-CM | POA: Diagnosis not present

## 2017-01-09 DIAGNOSIS — E1142 Type 2 diabetes mellitus with diabetic polyneuropathy: Secondary | ICD-10-CM | POA: Diagnosis not present

## 2017-01-09 DIAGNOSIS — Z794 Long term (current) use of insulin: Secondary | ICD-10-CM | POA: Diagnosis not present

## 2017-01-09 DIAGNOSIS — I440 Atrioventricular block, first degree: Secondary | ICD-10-CM

## 2017-01-09 DIAGNOSIS — Z01818 Encounter for other preprocedural examination: Secondary | ICD-10-CM | POA: Insufficient documentation

## 2017-01-09 DIAGNOSIS — C61 Malignant neoplasm of prostate: Secondary | ICD-10-CM | POA: Insufficient documentation

## 2017-01-09 DIAGNOSIS — K219 Gastro-esophageal reflux disease without esophagitis: Secondary | ICD-10-CM | POA: Diagnosis not present

## 2017-01-09 DIAGNOSIS — Z87891 Personal history of nicotine dependence: Secondary | ICD-10-CM | POA: Diagnosis not present

## 2017-01-09 DIAGNOSIS — J45909 Unspecified asthma, uncomplicated: Secondary | ICD-10-CM | POA: Diagnosis not present

## 2017-01-09 HISTORY — DX: Malignant (primary) neoplasm, unspecified: C80.1

## 2017-01-09 HISTORY — DX: Unspecified osteoarthritis, unspecified site: M19.90

## 2017-01-09 HISTORY — DX: Nausea with vomiting, unspecified: R11.2

## 2017-01-09 HISTORY — DX: Other specified postprocedural states: Z98.890

## 2017-01-09 HISTORY — DX: Gastro-esophageal reflux disease without esophagitis: K21.9

## 2017-01-09 HISTORY — DX: Unspecified asthma, uncomplicated: J45.909

## 2017-01-09 HISTORY — DX: Dyspnea, unspecified: R06.00

## 2017-01-09 HISTORY — DX: Essential (primary) hypertension: I10

## 2017-01-09 HISTORY — DX: Personal history of urinary calculi: Z87.442

## 2017-01-09 HISTORY — DX: Myoneural disorder, unspecified: G70.9

## 2017-01-09 LAB — CBC
HCT: 41 % (ref 39.0–52.0)
Hemoglobin: 13.7 g/dL (ref 13.0–17.0)
MCH: 28.7 pg (ref 26.0–34.0)
MCHC: 33.4 g/dL (ref 30.0–36.0)
MCV: 85.8 fL (ref 78.0–100.0)
Platelets: 191 K/uL (ref 150–400)
RBC: 4.78 MIL/uL (ref 4.22–5.81)
RDW: 13.4 % (ref 11.5–15.5)
WBC: 7.1 K/uL (ref 4.0–10.5)

## 2017-01-09 LAB — BASIC METABOLIC PANEL WITH GFR
Anion gap: 6 (ref 5–15)
BUN: 12 mg/dL (ref 6–20)
CO2: 28 mmol/L (ref 22–32)
Calcium: 9.7 mg/dL (ref 8.9–10.3)
Chloride: 107 mmol/L (ref 101–111)
Creatinine, Ser: 1.06 mg/dL (ref 0.61–1.24)
GFR calc Af Amer: >60 mL/min
GFR calc non Af Amer: >60 mL/min
Glucose, Bld: 93 mg/dL (ref 65–99)
Potassium: 3.6 mmol/L (ref 3.5–5.1)
Sodium: 141 mmol/L (ref 135–145)

## 2017-01-09 LAB — GLUCOSE, CAPILLARY: Glucose-Capillary: 91 mg/dL (ref 65–99)

## 2017-01-09 LAB — ABO/RH: ABO/RH(D): O POS

## 2017-01-09 NOTE — Progress Notes (Signed)
ekg 01/09/17 final epic

## 2017-01-09 NOTE — Progress Notes (Signed)
CXR 3/18  LOV 11/11/16 Labs 09/2016  EKG 12/2015 EKG 01/09/17  All on chart

## 2017-01-10 LAB — HEMOGLOBIN A1C
Hgb A1c MFr Bld: 6.6 % — ABNORMAL HIGH (ref 4.8–5.6)
MEAN PLASMA GLUCOSE: 143 mg/dL

## 2017-01-11 ENCOUNTER — Encounter (HOSPITAL_COMMUNITY): Payer: Self-pay | Admitting: Anesthesiology

## 2017-01-11 NOTE — H&P (Signed)
Office Visit Report     12/13/2016   --------------------------------------------------------------------------------   Joe Thornton. Joe Thornton  MRN: [id number]  PRIMARY CARE:  Cyndi Bender, [place]  DOB: Jul 04, 1951, 66 year old Male  REFERRING:  Joie Bimler, MD  SSN: -[id number]  PROVIDER:  Raynelle Bring, M.D.    LOCATION:  [facility] [phone]   --------------------------------------------------------------------------------   CC/HPI: CC: Prostate Cancer   Physician requesting consult: Dr. Joie Bimler  PCP: Cyndi Bender, PA-C   Joe Thornton is a 66 year old gentleman who was found to have an elevated PSA of 5.4 prompting a TRUS biopsy of the prostate on 10/25/16. This demonstrated Gleason 4+3=7 adenocarcinoma of the prostate with 12 out of 12 biopsy cores positive for malignancy.   Family history: He has a father who died of metastatic disease. He has a brother who was treated with surgical therapy.   Imaging studies:  CT of the abdomen and pelvis (11/03/16) -- 5 mm right lung base nodule stable since May. No other findings to suggest metastatic disease.  Bone scan (2/1/8) - Increased uptake at L5 and the bilateral sacroiliac joints, shoulders, knees, and ankles felt to most likely represent degenerative/arthritic changes without findings to strongly suggest metastatic disease.   PMH: He has a history of insulin-dependent diabetes, GERD, hypertension, hypercholesterolemia, and asthma.  PSH: Bilateral open inguinal hernia repairs.   TNM stage: cT1c N0 M0  PSA: 5.4  Gleason score: 4+3=7  Biopsy (10/25/16 - ready by Dr. Haywood Lasso, Acc # [id number], [facility]): /12 cores positive  Left: L lateral apex (90%, 4+3=7), L apex (90%, 3+4=7), L lateral mid (10%, 4+3=7), L mid (20%, 3+4=7), L lateral base (25%, 3+4=7), L base (20%, 3+4=7)  Right: R apex (50%, 3+4=7), R lateral apex (75%, 3+3=6), R mid (30%, 3+3=6), R lateral mid (30%, 3+3=6), R base (20%, 3+3=6), R  lateral base (30%, 3+3=6)  Prostate volume: 42.7 cc   Nomogram  OC disease: 40%  EPE: 58%  SVI: 7%  LNI: 7%  PFS (5 year, 10 year): 54%,38%   Urinary function: IPSS is 10.  Erectile function: SHIM score is 1. He has long-standing erectile dysfunction.     ALLERGIES: None   MEDICATIONS: Finasteride  Lisinopril  Metformin Hcl  Omeprazole  Lantus Solostar  Novolog  Rosuvastatin Calcium     GU PSH: None   NON-GU PSH: Hernia Repair    GU PMH: None   NON-GU PMH: Arthritis Diabetes Type 2 GERD Hypercholesterolemia Hypertension    FAMILY HISTORY: 1 son - Runs in Family 3 daughters - Runs in Family Bone Cancer - Father Heart Attack - Mother   SOCIAL HISTORY: Marital Status: Married Current Smoking Status: Patient smokes.   Tobacco Use Assessment Completed: Used Tobacco in last 30 days? Does not drink anymore.  Drinks 3 caffeinated drinks per day.    REVIEW OF SYSTEMS:    GU Review Male:   weak stream. Patient reports frequent urination, hard to postpone urination, get up at night to urinate, and leakage of urine. Patient denies burning/ pain with urination, stream starts and stops, trouble starting your streams, and have to strain to urinate .  Gastrointestinal (Upper):   Patient denies nausea and vomiting.  Gastrointestinal (Lower):   Patient reports constipation. Patient denies diarrhea.  Constitutional:   Patient reports weight loss. Patient denies fever, night sweats, and fatigue.  Skin:   Patient reports itching. Patient denies skin rash/ lesion.  Eyes:   Patient denies blurred vision  and double vision.  Ears/ Nose/ Throat:   Patient reports sinus problems. Patient denies sore throat.  Hematologic/Lymphatic:   Patient denies swollen glands and easy bruising.  Cardiovascular:   Patient denies leg swelling and chest pains.  Respiratory:   Patient reports cough. Patient denies shortness of breath.  Endocrine:   Patient reports excessive thirst.    Musculoskeletal:   Patient denies back pain and joint pain.  Neurological:   Patient denies headaches and dizziness.  Psychologic:   Patient denies depression and anxiety.   VITAL SIGNS:      12/13/2016 10:41 AM  Weight 210 lb / 95.25 kg  Height 69 in / 175.26 cm  BP 133/83 mmHg  Pulse 83 /min  BMI 31.0 kg/m   GU PHYSICAL EXAMINATION:    Prostate: Prostate about 50 grams. He does have mild induration of the right lateral aspect of the prostate.   MULTI-SYSTEM PHYSICAL EXAMINATION:    Constitutional: Well-nourished. No physical deformities. Normally developed. Good grooming.  Neck: Neck symmetrical, not swollen. Normal tracheal position.  Respiratory: No labored breathing, no use of accessory muscles. Clear bilaterally.  Cardiovascular: Normal temperature, normal extremity pulses, no swelling, no varicosities. Regular rate and rhythm.  Lymphatic: No enlargement of neck, axillae, groin.  Skin: No paleness, no jaundice, no cyanosis. No lesion, no ulcer, no rash.  Neurologic / Psychiatric: Oriented to time, oriented to place, oriented to person. No depression, no anxiety, no agitation.  Gastrointestinal: No mass, no tenderness, no rigidity. Mildly obese.  Eyes: Normal conjunctivae. Normal eyelids.  Ears, Nose, Mouth, and Throat: Left ear no scars, no lesions, no masses. Right ear no scars, no lesions, no masses. Nose no scars, no lesions, no masses. Normal hearing. Normal lips.  Musculoskeletal: Normal gait and station of head and neck.     PAST DATA REVIEWED:  Source Of History:  Patient  Lab Test Review:   PSA  Records Review:   Pathology Reports, Previous Patient Records  Urine Test Review:   Urinalysis  X-Ray Review: C.T. Abdomen/Pelvis: Reviewed Films. Findings as dictated above. Bone Scan: Reviewed Films. Findings as dictated above.    PROCEDURES:          Urinalysis w/Scope Dipstick Dipstick Cont'd Micro  Color: Yellow Bilirubin: Neg WBC/hpf: 0 - 5/hpf  Appearance:  Clear Ketones: Neg RBC/hpf: 0 - 2/hpf  Specific Gravity: 1.025 Blood: 2+ Bacteria: NS (Not Seen)  pH: 6.5 Protein: 2+ Cystals: NS (Not Seen)  Glucose: Neg Urobilinogen: 1.0 Casts: NS (Not Seen)    Nitrites: Neg Trichomonas: Not Present    Leukocyte Esterase: Neg Mucous: Not Present      Epithelial Cells: 0 - 5/hpf      Yeast: NS (Not Seen)      Sperm: Not Present    ASSESSMENT:      ICD-10 Details  1 GU:   Prostate Cancer - C61    PLAN:           Schedule Return Visit/Planned Activity: Other See Visit Notes             Note: Will call to schedule surgery.  Return Visit/Planned Activity: Next Available Appointment - PT/OT Referral             Note: Please schedule patient for preoperative PT prior to radical prostatectomy.            Document Letter(s):  Created for Patient: Clinical Summary         Notes:   1. Prostate cancer: I  had a detailed discussion with Joe Thornton, his wife, and his sister today regarding his prostate cancer diagnosis and options for management. I did recommend therapy of curative intent based on his disease parameters and life expectancy. We discussed that primary surgical therapy as well as radiation therapy with short-term androgen deprivation as reasonable options.   The patient was counseled about the natural history of prostate cancer and the standard treatment options that are available for prostate cancer. It was explained to him how his age and life expectancy, clinical stage, Gleason score, and PSA affect his prognosis, the decision to proceed with additional staging studies, as well as how that information influences recommended treatment strategies. We discussed the roles for active surveillance, radiation therapy, surgical therapy, androgen deprivation, as well as ablative therapy options for the treatment of prostate cancer as appropriate to his individual cancer situation. We discussed the risks and benefits of these options with regard to their  impact on cancer control and also in terms of potential adverse events, complications, and impact on quality of life particularly related to urinary and sexual function. The patient was encouraged to ask questions throughout the discussion today and all questions were answered to his stated satisfaction. In addition, the patient was provided with and/or directed to appropriate resources and literature for further education about prostate cancer and treatment options.   We discussed surgical therapy for prostate cancer including the different available surgical approaches. We discussed, in detail, the risks and expectations of surgery with regard to cancer control, urinary control, and erectile function as well as the expected postoperative recovery process. Additional risks of surgery including but not limited to bleeding, infection, hernia formation, nerve damage, lymphocele formation, bowel/rectal injury potentially necessitating colostomy, damage to the urinary tract resulting in urine leakage, urethral stricture, and the cardiopulmonary risks such as myocardial infarction, stroke, death, venothromboembolism, etc. were explained. The risk of open surgical conversion for robotic/laparoscopic prostatectomy was also discussed.   After discussion, he does adamantly wish to proceed with primary surgical therapy and declines a radiation oncology consultation. He will be scheduled for a non-nerve sparing robot-assisted laparoscopic radical prostatectomy and pelvic lymphadenectomy.   CC: Cyndi Bender, PA-C  Dr. Joie Bimler          E & M CODE: I spent at least 60 minutes face to face with the patient, more than 50% of that time was spent on counseling and/or coordinating care.     * Signed by Raynelle Bring, M.D. on 12/13/16 at 7:22 PM (EDT)*

## 2017-01-11 NOTE — Anesthesia Preprocedure Evaluation (Addendum)
Anesthesia Evaluation  Patient identified by MRN, date of birth, ID band Patient awake    Reviewed: Allergy & Precautions, NPO status , Patient's Chart, lab work & pertinent test results  History of Anesthesia Complications (+) PONV and history of anesthetic complications  Airway Mallampati: II  TM Distance: >3 FB Neck ROM: Full    Dental  (+) Edentulous Upper, Edentulous Lower   Pulmonary shortness of breath and with exertion, asthma , former smoker,    Pulmonary exam normal breath sounds clear to auscultation       Cardiovascular hypertension, Pt. on medications Normal cardiovascular exam Rhythm:Regular Rate:Normal     Neuro/Psych Peripheral neuropathy  Neuromuscular disease negative psych ROS   GI/Hepatic GERD  Medicated and Controlled,  Endo/Other  diabetes, Well Controlled, Type 2, Insulin Dependent  Renal/GU    Prostate Ca    Musculoskeletal  (+) Arthritis , Osteoarthritis,    Abdominal   Peds  Hematology   Anesthesia Other Findings   Reproductive/Obstetrics                            Anesthesia Physical Anesthesia Plan  ASA: III  Anesthesia Plan: General   Post-op Pain Management:    Induction: Intravenous  Airway Management Planned: Oral ETT  Additional Equipment:   Intra-op Plan:   Post-operative Plan: Extubation in OR  Informed Consent: I have reviewed the patients History and Physical, chart, labs and discussed the procedure including the risks, benefits and alternatives for the proposed anesthesia with the patient or authorized representative who has indicated his/her understanding and acceptance.     Plan Discussed with: Anesthesiologist, CRNA and Surgeon  Anesthesia Plan Comments:        Anesthesia Quick Evaluation

## 2017-01-12 ENCOUNTER — Observation Stay (HOSPITAL_COMMUNITY)
Admission: AD | Admit: 2017-01-12 | Discharge: 2017-01-13 | Disposition: A | Discharge status: Home / Self Care | Payer: Medicare Other | Source: Ambulatory Visit | Attending: Urology | Admitting: Urology

## 2017-01-12 ENCOUNTER — Encounter (HOSPITAL_COMMUNITY): Payer: Self-pay | Admitting: *Deleted

## 2017-01-12 ENCOUNTER — Ambulatory Visit (HOSPITAL_COMMUNITY): Payer: Medicare Other | Admitting: Certified Registered Nurse Anesthetist

## 2017-01-12 ENCOUNTER — Encounter (HOSPITAL_COMMUNITY)
Admission: AD | Disposition: A | Discharge status: Home / Self Care | Payer: Self-pay | Source: Ambulatory Visit | Attending: Urology

## 2017-01-12 DIAGNOSIS — Z794 Long term (current) use of insulin: Secondary | ICD-10-CM | POA: Insufficient documentation

## 2017-01-12 DIAGNOSIS — E1142 Type 2 diabetes mellitus with diabetic polyneuropathy: Secondary | ICD-10-CM | POA: Insufficient documentation

## 2017-01-12 DIAGNOSIS — K219 Gastro-esophageal reflux disease without esophagitis: Secondary | ICD-10-CM | POA: Diagnosis not present

## 2017-01-12 DIAGNOSIS — I10 Essential (primary) hypertension: Secondary | ICD-10-CM | POA: Diagnosis not present

## 2017-01-12 DIAGNOSIS — Z87891 Personal history of nicotine dependence: Secondary | ICD-10-CM | POA: Diagnosis not present

## 2017-01-12 DIAGNOSIS — E7800 Pure hypercholesterolemia, unspecified: Secondary | ICD-10-CM | POA: Insufficient documentation

## 2017-01-12 DIAGNOSIS — E119 Type 2 diabetes mellitus without complications: Secondary | ICD-10-CM | POA: Diagnosis not present

## 2017-01-12 DIAGNOSIS — J45909 Unspecified asthma, uncomplicated: Secondary | ICD-10-CM | POA: Diagnosis not present

## 2017-01-12 DIAGNOSIS — C61 Malignant neoplasm of prostate: Principal | ICD-10-CM | POA: Diagnosis present

## 2017-01-12 DIAGNOSIS — M1990 Unspecified osteoarthritis, unspecified site: Secondary | ICD-10-CM | POA: Insufficient documentation

## 2017-01-12 HISTORY — PX: PELVIC LYMPH NODE DISSECTION: SHX6543

## 2017-01-12 HISTORY — PX: ROBOT ASSISTED LAPAROSCOPIC RADICAL PROSTATECTOMY: SHX5141

## 2017-01-12 LAB — GLUCOSE, CAPILLARY
GLUCOSE-CAPILLARY: 77 mg/dL (ref 65–99)
GLUCOSE-CAPILLARY: 119 mg/dL — AB (ref 65–99)
GLUCOSE-CAPILLARY: 252 mg/dL — AB (ref 65–99)
GLUCOSE-CAPILLARY: 96 mg/dL (ref 65–99)

## 2017-01-12 LAB — TYPE AND SCREEN
ABO/RH(D): O POS
Antibody Screen: NEGATIVE

## 2017-01-12 LAB — HEMOGLOBIN AND HEMATOCRIT, BLOOD
HCT: 39.2 % (ref 39.0–52.0)
Hemoglobin: 13 g/dL (ref 13.0–17.0)

## 2017-01-12 SURGERY — XI ROBOTIC ASSISTED LAPAROSCOPIC RADICAL PROSTATECTOMY LEVEL 2
Anesthesia: General | Site: Abdomen

## 2017-01-12 MED ORDER — ONDANSETRON HCL 4 MG/2ML IJ SOLN
INTRAMUSCULAR | Status: DC | PRN
  Administered 2017-01-12: 8 mg via INTRAVENOUS

## 2017-01-12 MED ORDER — INSULIN ASPART 100 UNIT/ML ~~LOC~~ SOLN
0.0000 [IU] | Freq: Three times a day (TID) | SUBCUTANEOUS | Status: DC
  Administered 2017-01-12: 5 [IU] via SUBCUTANEOUS

## 2017-01-12 MED ORDER — CEFAZOLIN IN D5W 1 GM/50ML IV SOLN
1.0000 g | Freq: Three times a day (TID) | INTRAVENOUS | Status: AC
  Administered 2017-01-12 (×2): 1 g via INTRAVENOUS
  Filled 2017-01-12 (×2): qty 50

## 2017-01-12 MED ORDER — SULFAMETHOXAZOLE-TRIMETHOPRIM 800-160 MG PO TABS: 1.0000 | ORAL_TABLET | Freq: Two times a day (BID) | ORAL | 0 refills | Status: AC

## 2017-01-12 MED ORDER — PROMETHAZINE HCL 25 MG/ML IJ SOLN: 6.2500 mg | INTRAMUSCULAR | Status: DC | PRN

## 2017-01-12 MED ORDER — MEPERIDINE HCL 50 MG/ML IJ SOLN: 6.2500 mg | INTRAMUSCULAR | Status: DC | PRN

## 2017-01-12 MED ORDER — HYDROMORPHONE HCL 1 MG/ML IJ SOLN
INTRAMUSCULAR | Status: DC | PRN
  Administered 2017-01-12 (×2): 0.5 mg via INTRAVENOUS

## 2017-01-12 MED ORDER — FENTANYL CITRATE (PF) 100 MCG/2ML IJ SOLN
INTRAMUSCULAR | Status: DC | PRN
  Administered 2017-01-12 (×5): 50 ug via INTRAVENOUS

## 2017-01-12 MED ORDER — KETOROLAC TROMETHAMINE 15 MG/ML IJ SOLN
15.0000 mg | Freq: Four times a day (QID) | INTRAMUSCULAR | Status: DC
  Administered 2017-01-12 – 2017-01-13 (×5): 15 mg via INTRAVENOUS
  Filled 2017-01-12 (×5): qty 1

## 2017-01-12 MED ORDER — PHENYLEPHRINE HCL 10 MG/ML IJ SOLN
INTRAMUSCULAR | Status: DC | PRN
  Administered 2017-01-12: 80 ug via INTRAVENOUS

## 2017-01-12 MED ORDER — OXYCODONE HCL 5 MG PO TABS
5.0000 mg | ORAL_TABLET | ORAL | Status: DC | PRN
  Administered 2017-01-12: 5 mg via ORAL
  Filled 2017-01-12 (×2): qty 1

## 2017-01-12 MED ORDER — HYDROMORPHONE HCL 2 MG/ML IJ SOLN
0.2500 mg | INTRAMUSCULAR | Status: DC | PRN
  Administered 2017-01-12 (×2): 0.5 mg via INTRAVENOUS

## 2017-01-12 MED ORDER — LACTATED RINGERS IV SOLN
INTRAVENOUS | Status: DC | PRN
Start: 2017-01-12 — End: 2017-01-12
  Administered 2017-01-12 (×2): via INTRAVENOUS

## 2017-01-12 MED ORDER — BUPIVACAINE-EPINEPHRINE 0.25% -1:200000 IJ SOLN
INTRAMUSCULAR | Status: DC | PRN
  Administered 2017-01-12: 30 mL

## 2017-01-12 MED ORDER — PANTOPRAZOLE SODIUM 40 MG PO TBEC
80.0000 mg | DELAYED_RELEASE_TABLET | Freq: Every day | ORAL | Status: DC
  Administered 2017-01-13: 80 mg via ORAL
  Filled 2017-01-12: qty 2

## 2017-01-12 MED ORDER — DOCUSATE SODIUM 100 MG PO CAPS
100.0000 mg | ORAL_CAPSULE | Freq: Two times a day (BID) | ORAL | Status: DC
  Administered 2017-01-12 – 2017-01-13 (×2): 100 mg via ORAL
  Filled 2017-01-12 (×2): qty 1

## 2017-01-12 MED ORDER — HEPARIN SODIUM (PORCINE) 1000 UNIT/ML IJ SOLN
INTRAMUSCULAR | Status: AC
  Filled 2017-01-12: qty 1

## 2017-01-12 MED ORDER — MORPHINE SULFATE (PF) 2 MG/ML IV SOLN
2.0000 mg | INTRAVENOUS | Status: DC | PRN
  Administered 2017-01-12: 2 mg via INTRAVENOUS
  Filled 2017-01-12: qty 2

## 2017-01-12 MED ORDER — KCL IN DEXTROSE-NACL 20-5-0.45 MEQ/L-%-% IV SOLN
INTRAVENOUS | Status: DC
  Administered 2017-01-12: 14:00:00 via INTRAVENOUS
  Filled 2017-01-12 (×2): qty 1000

## 2017-01-12 MED ORDER — MIDAZOLAM HCL 5 MG/5ML IJ SOLN
INTRAMUSCULAR | Status: DC | PRN
  Administered 2017-01-12: 2 mg via INTRAVENOUS

## 2017-01-12 MED ORDER — SENNA 8.6 MG PO TABS
1.0000 | ORAL_TABLET | Freq: Two times a day (BID) | ORAL | Status: DC
  Administered 2017-01-12 – 2017-01-13 (×2): 8.6 mg via ORAL
  Filled 2017-01-12 (×2): qty 1

## 2017-01-12 MED ORDER — PROPOFOL 10 MG/ML IV BOLUS
INTRAVENOUS | Status: DC | PRN
  Administered 2017-01-12: 150 mg via INTRAVENOUS

## 2017-01-12 MED ORDER — DIPHENHYDRAMINE HCL 50 MG/ML IJ SOLN: 12.5000 mg | Freq: Four times a day (QID) | INTRAMUSCULAR | Status: DC | PRN

## 2017-01-12 MED ORDER — OXYBUTYNIN CHLORIDE 5 MG PO TABS: 5.0000 mg | ORAL_TABLET | Freq: Three times a day (TID) | ORAL | Status: DC | PRN

## 2017-01-12 MED ORDER — SUGAMMADEX SODIUM 200 MG/2ML IV SOLN
INTRAVENOUS | Status: DC | PRN
  Administered 2017-01-12: 400 mg via INTRAVENOUS

## 2017-01-12 MED ORDER — INSULIN GLARGINE 100 UNIT/ML ~~LOC~~ SOLN
25.0000 [IU] | Freq: Two times a day (BID) | SUBCUTANEOUS | Status: DC
  Administered 2017-01-12 – 2017-01-13 (×2): 25 [IU] via SUBCUTANEOUS
  Filled 2017-01-12 (×3): qty 0.25

## 2017-01-12 MED ORDER — DIPHENHYDRAMINE HCL 12.5 MG/5ML PO ELIX: 12.5000 mg | ORAL_SOLUTION | Freq: Four times a day (QID) | ORAL | Status: DC | PRN

## 2017-01-12 MED ORDER — LIDOCAINE HCL (CARDIAC) 20 MG/ML IV SOLN
INTRAVENOUS | Status: DC | PRN
  Administered 2017-01-12: 100 mg via INTRAVENOUS

## 2017-01-12 MED ORDER — HYDROMORPHONE HCL 2 MG/ML IJ SOLN
INTRAMUSCULAR | Status: AC
  Filled 2017-01-12: qty 1

## 2017-01-12 MED ORDER — ROCURONIUM BROMIDE 100 MG/10ML IV SOLN
INTRAVENOUS | Status: DC | PRN
  Administered 2017-01-12: 50 mg via INTRAVENOUS
  Administered 2017-01-12: 15 mg via INTRAVENOUS
  Administered 2017-01-12: 20 mg via INTRAVENOUS
  Administered 2017-01-12: 15 mg via INTRAVENOUS

## 2017-01-12 MED ORDER — HYDROCODONE-ACETAMINOPHEN 5-325 MG PO TABS: 1.0000 | ORAL_TABLET | Freq: Four times a day (QID) | ORAL | 0 refills | Status: DC | PRN

## 2017-01-12 MED ORDER — ONDANSETRON HCL 4 MG/2ML IJ SOLN: 4.0000 mg | INTRAMUSCULAR | Status: DC | PRN

## 2017-01-12 MED ORDER — ROSUVASTATIN CALCIUM 20 MG PO TABS
40.0000 mg | ORAL_TABLET | Freq: Every day | ORAL | Status: DC
  Administered 2017-01-12: 40 mg via ORAL
  Filled 2017-01-12: qty 2

## 2017-01-12 MED ORDER — SODIUM CHLORIDE 0.9 % IR SOLN
Status: DC | PRN
  Administered 2017-01-12: 1000 mL via INTRAVESICAL

## 2017-01-12 MED ORDER — BUPIVACAINE HCL (PF) 0.25 % IJ SOLN
INTRAMUSCULAR | Status: AC
  Filled 2017-01-12: qty 30

## 2017-01-12 MED ORDER — DEXAMETHASONE SODIUM PHOSPHATE 4 MG/ML IJ SOLN
INTRAMUSCULAR | Status: DC | PRN
  Administered 2017-01-12: 4 mg via INTRAVENOUS

## 2017-01-12 MED ORDER — ACETAMINOPHEN 325 MG PO TABS
650.0000 mg | ORAL_TABLET | Freq: Four times a day (QID) | ORAL | Status: DC
  Administered 2017-01-12 – 2017-01-13 (×4): 650 mg via ORAL
  Filled 2017-01-12 (×4): qty 2

## 2017-01-12 MED ORDER — SODIUM CHLORIDE 0.9 % IV BOLUS (SEPSIS)
1000.0000 mL | Freq: Once | INTRAVENOUS | Status: AC
  Administered 2017-01-12: 1000 mL via INTRAVENOUS

## 2017-01-12 MED ORDER — LACTATED RINGERS IV SOLN
INTRAVENOUS | Status: DC | PRN
  Administered 2017-01-12: 1000 mL

## 2017-01-12 MED ORDER — CEFAZOLIN SODIUM-DEXTROSE 2-4 GM/100ML-% IV SOLN
2.0000 g | INTRAVENOUS | Status: AC
  Administered 2017-01-12: 2 g via INTRAVENOUS

## 2017-01-12 SURGICAL SUPPLY — 54 items
ADH SKN CLS APL DERMABOND .7 (GAUZE/BANDAGES/DRESSINGS) ×2
APPLICATOR COTTON TIP 6IN STRL (MISCELLANEOUS) ×4 IMPLANT
CATH FOLEY 2WAY SLVR 18FR 30CC (CATHETERS) ×4 IMPLANT
CATH ROBINSON RED A/P 16FR (CATHETERS) ×4 IMPLANT
CATH ROBINSON RED A/P 8FR (CATHETERS) ×4 IMPLANT
CATH TIEMANN FOLEY 18FR 5CC (CATHETERS) ×4 IMPLANT
CHLORAPREP W/TINT 26ML (MISCELLANEOUS) ×4 IMPLANT
CLIP LIGATING HEM O LOK PURPLE (MISCELLANEOUS) ×8 IMPLANT
COVER SURGICAL LIGHT HANDLE (MISCELLANEOUS) ×4 IMPLANT
COVER TIP SHEARS 8 DVNC (MISCELLANEOUS) ×2 IMPLANT
COVER TIP SHEARS 8MM DA VINCI (MISCELLANEOUS) ×4
CUTTER ECHEON FLEX ENDO 45 340 (ENDOMECHANICALS) ×4 IMPLANT
DECANTER SPIKE VIAL GLASS SM (MISCELLANEOUS) ×4 IMPLANT
DERMABOND ADVANCED (GAUZE/BANDAGES/DRESSINGS) ×2
DERMABOND ADVANCED .7 DNX12 (GAUZE/BANDAGES/DRESSINGS) IMPLANT
DRAPE ARM DVNC X/XI (DISPOSABLE) ×8 IMPLANT
DRAPE COLUMN DVNC XI (DISPOSABLE) ×2 IMPLANT
DRAPE DA VINCI XI ARM (DISPOSABLE) ×8
DRAPE DA VINCI XI COLUMN (DISPOSABLE) ×2
DRAPE SURG IRRIG POUCH 19X23 (DRAPES) ×4 IMPLANT
DRSG TEGADERM 4X4.75 (GAUZE/BANDAGES/DRESSINGS) ×4 IMPLANT
ELECT REM PT RETURN 15FT ADLT (MISCELLANEOUS) ×4 IMPLANT
GLOVE BIO SURGEON STRL SZ 6.5 (GLOVE) ×3 IMPLANT
GLOVE BIO SURGEONS STRL SZ 6.5 (GLOVE) ×1
GLOVE BIOGEL M STRL SZ7.5 (GLOVE) ×8 IMPLANT
GOWN STRL REUS W/TWL LRG LVL3 (GOWN DISPOSABLE) ×12 IMPLANT
HOLDER FOLEY CATH W/STRAP (MISCELLANEOUS) ×4 IMPLANT
IRRIG SUCT STRYKERFLOW 2 WTIP (MISCELLANEOUS) ×4
IRRIGATION SUCT STRKRFLW 2 WTP (MISCELLANEOUS) ×2 IMPLANT
IV LACTATED RINGERS 1000ML (IV SOLUTION) ×4 IMPLANT
NDL SAFETY ECLIPSE 18X1.5 (NEEDLE) ×2 IMPLANT
NEEDLE HYPO 18GX1.5 SHARP (NEEDLE) ×4
PACK ROBOT UROLOGY CUSTOM (CUSTOM PROCEDURE TRAY) ×4 IMPLANT
RELOAD GREEN ECHELON 45 (STAPLE) ×4 IMPLANT
SEAL CANN UNIV 5-8 DVNC XI (MISCELLANEOUS) ×8 IMPLANT
SEAL XI 5MM-8MM UNIVERSAL (MISCELLANEOUS) ×8
SOLUTION ELECTROLUBE (MISCELLANEOUS) ×4 IMPLANT
SUT ETHILON 3 0 PS 1 (SUTURE) ×6 IMPLANT
SUT MNCRL 3 0 RB1 (SUTURE) ×2 IMPLANT
SUT MNCRL 3 0 VIOLET RB1 (SUTURE) ×2 IMPLANT
SUT MNCRL AB 4-0 PS2 18 (SUTURE) ×8 IMPLANT
SUT MONOCRYL 3 0 RB1 (SUTURE) ×4
SUT VIC AB 0 CT1 27 (SUTURE) ×4
SUT VIC AB 0 CT1 27XBRD ANTBC (SUTURE) ×2 IMPLANT
SUT VIC AB 0 UR5 27 (SUTURE) ×4 IMPLANT
SUT VIC AB 2-0 SH 27 (SUTURE) ×4
SUT VIC AB 2-0 SH 27X BRD (SUTURE) ×2 IMPLANT
SUT VIC AB 3-0 SH 27 (SUTURE) ×4
SUT VIC AB 3-0 SH 27X BRD (SUTURE) IMPLANT
SUT VICRYL 0 UR6 27IN ABS (SUTURE) ×8 IMPLANT
SYR 27GX1/2 1ML LL SAFETY (SYRINGE) ×4 IMPLANT
TOWEL OR 17X26 10 PK STRL BLUE (TOWEL DISPOSABLE) ×4 IMPLANT
TOWEL OR NON WOVEN STRL DISP B (DISPOSABLE) ×4 IMPLANT
WATER STERILE IRR 1500ML POUR (IV SOLUTION) ×8 IMPLANT

## 2017-01-12 NOTE — Interval H&P Note (Signed)
History and Physical Interval Note:  01/12/2017 7:03 AM  Joe Thornton  has presented today for surgery, with the diagnosis of PROSTATE CANCER  The various methods of treatment have been discussed with the patient and family. After consideration of risks, benefits and other options for treatment, the patient has consented to  Procedure(s): XI ROBOTIC ASSISTED LAPAROSCOPIC RADICAL PROSTATECTOMY LEVEL 2 (N/A) PELVIC LYMPH NODE DISSECTION (Bilateral) as a surgical intervention .  The patient's history has been reviewed, patient examined, no change in status, stable for surgery.  I have reviewed the patient's chart and labs.  Questions were answered to the patient's satisfaction.     Jasmeen Fritsch,LES

## 2017-01-12 NOTE — Progress Notes (Signed)
Post-op note  Subjective: The patient is doing well.  No complaints. Denies N/V.  UO improved ~200cc in bag  Objective: Vital signs in last 24 hours: Temp:  [97.5 F (36.4 C)-98.3 F (36.8 C)] 98.1 F (36.7 C) (04/12 1257) Pulse Rate:  [60-80] 64 (04/12 1257) Resp:  [11-18] 13 (04/12 1257) BP: (112-146)/(67-93) 134/83 (04/12 1257) SpO2:  [98 %-100 %] 99 % (04/12 1257) Weight:  [93.4 kg (206 lb)] 93.4 kg (206 lb) (04/12 0526)  Intake/Output from previous day: No intake/output data recorded. Intake/Output this shift: Total I/O In: 2980 [I.V.:1900; Other:30; IV HRVACQPEA:8350] Out: 215 [Urine:90; Drains:25; Blood:100]  Physical Exam:  General: Alert and oriented. Abdomen: Soft, Nondistended. Incisions: Clean and dry. Urine: pink  Lab Results:  Recent Labs  01/12/17 1146  HGB 13.0  HCT 39.2    Assessment/Plan: POD#0   1) Continue to monitor  2) DVT prophy, clears, IS, amb, pain control   LOS: 0 days   Jahira Swiss 01/12/2017, 3:00 PM

## 2017-01-12 NOTE — Op Note (Addendum)
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 Robotic assisted laparoscopic radical prostatectomy (non nerve sparing) 2. Bilateral robotic assisted laparoscopic pelvic lymphadenectomy  Surgeon: Pryor Curia. M.D.  Assistant(s): Debbrah Alar, PA-C  An assistant was required for this surgical procedure.  The duties of the assistant included but were not limited to suctioning, passing suture, camera manipulation, retraction. This procedure would not be able to be performed without an Environmental consultant.  Anesthesia: General  Complications: None  EBL: 150 mL  IVF:  1400 mL crystalloid  Specimens: 1. Prostate and seminal vesicles 2. Right pelvic lymph nodes 3. Left pelvic lymph nodes  Disposition of specimens: Pathology  Drains: 1. 20 Fr coude catheter 2. # 19 Blake pelvic drain  Indication: Joe Thornton is a 66 y.o. year old patient with clinically localized prostate cancer.  After a thorough review of the management options for treatment of prostate cancer, he elected to proceed with surgical therapy and the above procedure(s).  We have discussed the potential benefits and risks of the procedure, side effects of the proposed treatment, the likelihood of the patient achieving the goals of the procedure, and any potential problems that might occur during the procedure or recuperation. Informed consent has been obtained.  Description of procedure:  The patient was taken to the operating room and a general anesthetic was administered. He was given preoperative antibiotics, placed in the dorsal lithotomy position, and prepped and draped in the usual sterile fashion. Next a preoperative timeout was performed. A urethral catheter was placed into the bladder and a site was selected near the umbilicus for  placement of the camera port. This was placed using a standard open Hassan technique which allowed entry into the peritoneal cavity under direct vision and without difficulty. A 12 mm port was placed and a pneumoperitoneum established. The camera was then used to inspect the abdomen and there was no evidence of any intra-abdominal injuries or other abnormalities. The remaining abdominal ports were then placed. 8 mm robotic ports were placed in the right lower quadrant, left lower quadrant, and far left lateral abdominal wall. A 5 mm port was placed in the right upper quadrant and a 12 mm port was placed in the right lateral abdominal wall for laparoscopic assistance. All ports were placed under direct vision without difficulty. The surgical cart was then docked.   Utilizing the cautery scissors, the bladder was reflected posteriorly allowing entry into the space of Retzius and identification of the endopelvic fascia and prostate. The periprostatic fat was then removed from the prostate allowing full exposure of the endopelvic fascia. The endopelvic fascia was then incised from the apex back to the base of the prostate bilaterally and the underlying levator muscle fibers were swept laterally off the prostate thereby isolating the dorsal venous complex. The dorsal vein was then stapled and divided with a 45 mm Flex Echelon stapler. Attention then turned to the bladder neck which was divided anteriorly thereby allowing entry into the bladder and exposure of the urethral catheter. The catheter balloon was deflated and the catheter was brought into the operative field and used to retract the prostate anteriorly. The posterior bladder neck was then examined and was divided allowing further dissection between the bladder and prostate posteriorly until the vasa deferentia and seminal vessels were identified. The vasa deferentia were isolated, divided, and lifted anteriorly. The  seminal vesicles were dissected down to  their tips with care to control the seminal vascular arterial blood supply. These structures were then lifted anteriorly and the space between Denonvillier's fascia and the anterior rectum was developed with a combination of sharp and blunt dissection. This isolated the vascular pedicles of the prostate.  A wide non nerve sparing dissection was performed with Weck clips used to ligate the vascular pedicles of the prostate bilaterally. The vascular pedicles of the prostate were then divided.  The urethra was then sharply transected allowing the prostate specimen to be disarticulated. The pelvis was copiously irrigated and hemostasis was ensured. There was no evidence for rectal injury.  Attention then turned to the right pelvic sidewall. The fibrofatty tissue between the external iliac vein, confluence of the iliac vessels, hypogastric artery, and Cooper's ligament was dissected free from the pelvic sidewall with care to preserve the obturator nerve. Weck clips were used for lymphostasis and hemostasis. An identical procedure was performed on the contralateral side and the lymphatic packets were removed for permanent pathologic analysis.  Attention then turned to the urethral anastomosis. A 2-0 Vicryl slip knot was placed between Denonvillier's fascia, the posterior bladder neck, and the posterior urethra to reapproximate these structures. A double-armed 3-0 Monocryl suture was then used to perform a 360 running tension-free anastomosis between the bladder neck and urethra. A new urethral catheter was then placed into the bladder and irrigated. There were no blood clots within the bladder and the anastomosis appeared to be watertight. A #19 Blake drain was then brought through the left lateral 8 mm port site and positioned appropriately within the pelvis. It was secured to the skin with a nylon suture. The surgical cart was then undocked. The right lateral 12 mm port site was closed at the fascial level with  a 0 Vicryl suture placed laparoscopically. All remaining ports were then removed under direct vision. The prostate specimen was removed intact within the Endopouch retrieval bag via the periumbilical camera port site. This fascial opening was closed with two running 0 Vicryl sutures. 0.25% Marcaine was then injected into all port sites and all incisions were reapproximated at the skin level with 3-0 Monocryl subcuticular sutures. Dermabond was applied to the skin. The patient appeared to tolerate the procedure well and without complications. The patient was able to be extubated and transferred to the recovery unit in satisfactory condition.  Pryor Curia MD

## 2017-01-12 NOTE — Discharge Instructions (Signed)

## 2017-01-12 NOTE — Transfer of Care (Signed)
Immediate Anesthesia Transfer of Care Note  Patient: Joe Thornton  Procedure(s) Performed: Procedure(s): XI ROBOTIC ASSISTED LAPAROSCOPIC RADICAL PROSTATECTOMY LEVEL 2 (N/A) PELVIC LYMPH NODE DISSECTION (Bilateral)  Patient Location: PACU  Anesthesia Type:General  Level of Consciousness: awake, alert  and oriented  Airway & Oxygen Therapy: Patient Spontanous Breathing and Patient connected to face mask  Post-op Assessment: Post -op Vital signs reviewed and stable and Patient moving all extremities  Post vital signs: stable  Last Vitals:  Vitals:   01/12/17 0520 01/12/17 1111  BP: (!) 146/80 136/77  Pulse: 72   Resp: 18   Temp: 36.8 C 36.8 C    Last Pain:  Vitals:   01/12/17 1111  TempSrc: Axillary      Patients Stated Pain Goal: 4 (98/92/11 9417)  Complications: No apparent anesthesia complications

## 2017-01-12 NOTE — Care Management Note (Signed)
Case Management Note  Patient Details  Name: Joe Thornton MRN: [id number] Date of Birth: 1951/03/16  Subjective/Objective:65 y/o m admitted w/Prostate Ca.s/p Lap rad prostatectomy. From home.                    Action/Plan:d/c plan home.   Expected Discharge Date:                  Expected Discharge Plan:  Home/Self Care  In-House Referral:     Discharge planning Services  CM Consult  Post Acute Care Choice:    Choice offered to:     DME Arranged:    DME Agency:     HH Arranged:    HH Agency:     Status of Service:  In process, will continue to follow  If discussed at Long Length of Stay Meetings, dates discussed:    Additional Comments:  Dessa Phi, RN 01/12/2017, 2:11 PM

## 2017-01-12 NOTE — Discharge Summary (Signed)
Physician Discharge Summary  Patient ID: Joe Thornton MRN: [id number] DOB/AGE: [birth year] 66 y.o.  Admit date: 01/12/2017 Discharge date: 01/13/2017  Admission Diagnoses: Prostate cancer  Discharge Diagnoses: Prostate cancer  Discharged Condition: good  Hospital Course:  Joe Thornton is a 66 y.o. male who is s/p RAL prostatectomy and bilateral pelvic lymph node dissection on 01/12/2017 with Dr. Alinda Money for prostate cancer.  The patient tolerated the procedure well, was extubated in the OR and taken to the recovery unit for routine postoperative care. They were then transferred to the floor. By POD1 he had met the usual goals for discharge including ambulating at a preoperative capacity, having pain controlled with PO PRN medications, and tolerating a regular diet.    Treatments: surgery: as noted above    Disposition: 01-Home or Self Care   Allergies as of 01/13/2017   No Known Allergies     Medication List    TAKE these medications   HYDROcodone-acetaminophen 5-325 MG tablet Commonly known as:  NORCO Take 1-2 tablets by mouth every 6 (six) hours as needed for moderate pain or severe pain.   insulin glargine 100 unit/mL Sopn Commonly known as:  LANTUS Inject 50 Units into the skin 2 (two) times daily.   lisinopril 20 MG tablet Commonly known as:  PRINIVIL,ZESTRIL Take 20 mg by mouth daily.   metFORMIN 1000 MG tablet Commonly known as:  GLUCOPHAGE Take 1,000 mg by mouth 2 (two) times daily.   NOVOLOG FLEXPEN 100 UNIT/ML FlexPen Generic drug:  insulin aspart Inject 10 Units into the skin 3 (three) times daily before meals.   omeprazole 40 MG capsule Commonly known as:  PRILOSEC Take 40 mg by mouth daily before breakfast.   rosuvastatin 40 MG tablet Commonly known as:  CRESTOR Take 40 mg by mouth at bedtime.   sulfamethoxazole-trimethoprim 800-160 MG tablet Commonly known as:  BACTRIM DS,SEPTRA DS Take 1 tablet by mouth 2 (two) times daily. Start the  day prior to foley removal appointment      Follow-up Information    Sione Baumgarten,LES, MD On 01/20/2017.   Specialty:  Urology Why:  at 1:00 Contact information: [address] [place] [address] [phone]           Signed: Dutch Gray 01/13/2017, 11:56 AM

## 2017-01-12 NOTE — Progress Notes (Signed)
Taking over care of patient. Agree with Previous RN assessment, denies any needs at this time. Will continue to monitor.

## 2017-01-12 NOTE — Anesthesia Procedure Notes (Signed)
Procedure Name: Intubation Date/Time: 01/12/2017 7:24 AM Performed by: Claudia Desanctis Pre-anesthesia Checklist: Patient identified, Emergency Drugs available, Suction available and Patient being monitored Patient Re-evaluated:Patient Re-evaluated prior to inductionOxygen Delivery Method: Circle system utilized Preoxygenation: Pre-oxygenation with 100% oxygen Intubation Type: IV induction Ventilation: Mask ventilation without difficulty Laryngoscope Size: Miller and 2 Grade View: Grade I Number of attempts: 1 Airway Equipment and Method: Stylet Placement Confirmation: ETT inserted through vocal cords under direct vision,  positive ETCO2 and breath sounds checked- equal and bilateral Secured at: 22 cm Tube secured with: Tape Dental Injury: Teeth and Oropharynx as per pre-operative assessment

## 2017-01-12 NOTE — Anesthesia Postprocedure Evaluation (Signed)
Anesthesia Post Note  Patient: Joe Thornton  Procedure(s) Performed: Procedure(s) (LRB): XI ROBOTIC ASSISTED LAPAROSCOPIC RADICAL PROSTATECTOMY LEVEL 2 (N/A) PELVIC LYMPH NODE DISSECTION (Bilateral)  Patient location during evaluation: PACU Anesthesia Type: General Level of consciousness: awake and alert and oriented Pain management: pain level controlled Vital Signs Assessment: post-procedure vital signs reviewed and stable Respiratory status: spontaneous breathing, nonlabored ventilation, respiratory function stable and patient connected to nasal cannula oxygen Cardiovascular status: blood pressure returned to baseline and stable Postop Assessment: no signs of nausea or vomiting Anesthetic complications: no       Last Vitals:  Vitals:   01/12/17 1130 01/12/17 1145  BP: 118/74 119/81  Pulse: 64 62  Resp: 13 12  Temp:      Last Pain:  Vitals:   01/12/17 1145  TempSrc:   PainSc: 4                  Kippy Gohman A.

## 2017-01-13 DIAGNOSIS — I10 Essential (primary) hypertension: Secondary | ICD-10-CM | POA: Diagnosis not present

## 2017-01-13 DIAGNOSIS — J45909 Unspecified asthma, uncomplicated: Secondary | ICD-10-CM | POA: Diagnosis not present

## 2017-01-13 DIAGNOSIS — K219 Gastro-esophageal reflux disease without esophagitis: Secondary | ICD-10-CM | POA: Diagnosis not present

## 2017-01-13 DIAGNOSIS — M1990 Unspecified osteoarthritis, unspecified site: Secondary | ICD-10-CM | POA: Diagnosis not present

## 2017-01-13 DIAGNOSIS — C61 Malignant neoplasm of prostate: Secondary | ICD-10-CM | POA: Diagnosis not present

## 2017-01-13 DIAGNOSIS — E1142 Type 2 diabetes mellitus with diabetic polyneuropathy: Secondary | ICD-10-CM | POA: Diagnosis not present

## 2017-01-13 DIAGNOSIS — Z794 Long term (current) use of insulin: Secondary | ICD-10-CM | POA: Diagnosis not present

## 2017-01-13 DIAGNOSIS — E7800 Pure hypercholesterolemia, unspecified: Secondary | ICD-10-CM | POA: Diagnosis not present

## 2017-01-13 DIAGNOSIS — Z87891 Personal history of nicotine dependence: Secondary | ICD-10-CM | POA: Diagnosis not present

## 2017-01-13 LAB — HEMOGLOBIN AND HEMATOCRIT, BLOOD
HCT: 35.5 % — ABNORMAL LOW (ref 39.0–52.0)
HEMOGLOBIN: 11.6 g/dL — AB (ref 13.0–17.0)

## 2017-01-13 LAB — GLUCOSE, CAPILLARY
GLUCOSE-CAPILLARY: 91 mg/dL (ref 65–99)
Glucose-Capillary: 102 mg/dL — ABNORMAL HIGH (ref 65–99)

## 2017-01-13 MED ORDER — BISACODYL 10 MG RE SUPP
10.0000 mg | Freq: Once | RECTAL | Status: AC
  Administered 2017-01-13: 10 mg via RECTAL
  Filled 2017-01-13: qty 1

## 2017-01-13 MED ORDER — HYDROCODONE-ACETAMINOPHEN 5-325 MG PO TABS
1.0000 | ORAL_TABLET | Freq: Four times a day (QID) | ORAL | Status: DC | PRN
  Administered 2017-01-13: 2 via ORAL
  Filled 2017-01-13: qty 2

## 2017-01-13 NOTE — Care Management Note (Signed)
Case Management Note  Patient Details  Name: Joe Thornton MRN: [id number] Date of Birth: July 04, 1951  Subjective/Objective: 66 y/o m admitted w/Prostate Ca. From home. Condition code 44 given-patient voiced understanding. No further CM needs.                   Action/Plan:d/c home.   Expected Discharge Date:  01/13/17               Expected Discharge Plan:  Home/Self Care  In-House Referral:     Discharge planning Services  CM Consult  Post Acute Care Choice:    Choice offered to:     DME Arranged:    DME Agency:     HH Arranged:    HH Agency:     Status of Service:  Completed, signed off  If discussed at [employer] of Stay Meetings, dates discussed:    Additional Comments:  Dessa Phi, RN 01/13/2017, 12:12 PM

## 2017-01-13 NOTE — Care Management Obs Status (Signed)
[facility] NOTIFICATION   Patient Details  Name: Joe Thornton MRN: [id number] Date of Birth: Feb 19, 1951   Medicare Observation Status Notification Given: Yes      Dessa Phi, RN 01/13/2017, 12:09 PM

## 2017-01-13 NOTE — Progress Notes (Signed)
Patient ID: Joe Thornton, male   DOB: 1951/02/28, 66 y.o.   MRN: [id number]   1 Day Post-Op Subjective: The patient is doing well.  No nausea or vomiting. Pain is adequately controlled.  Objective: Vital signs in last 24 hours: Temp:  [97.5 F (36.4 C)-98.4 F (36.9 C)] 98.4 F (36.9 C) (04/13 0532) Pulse Rate:  [60-80] 61 (04/13 0532) Resp:  [11-18] 18 (04/13 0532) BP: (107-136)/(53-93) 107/53 (04/13 0532) SpO2:  [96 %-100 %] 96 % (04/13 0532)  Intake/Output from previous day: 04/12 0701 - 04/13 0700 In: 3160 [I.V.:2080; IV Piggyback:1050] Out: 8875 [Urine:1590; Drains:105; Blood:100] Intake/Output this shift: No intake/output data recorded.  Physical Exam:  General: Alert and oriented. CV: RRR Lungs: Clear bilaterally. GI: Soft, Nondistended. Incisions: Clean, dry, and intact Urine: Clear Extremities: Nontender, no erythema, no edema.  Lab Results:  Recent Labs  01/12/17 1146 01/13/17 0515  HGB 13.0 11.6*  HCT 39.2 35.5*      Assessment/Plan: POD# 1 s/p robotic prostatectomy.  1) SL IVF 2) Ambulate, Incentive spirometry 3) Transition to oral pain medication 4) Dulcolax suppository 5) D/C pelvic drain 6) Plan for likely discharge later today   Pryor Curia. MD   LOS: 1 day   Corine Solorio,LES 01/13/2017, 7:30 AM

## 2017-01-13 NOTE — Care Management CC44 (Signed)
Condition Code 44 Documentation Completed  Patient Details  Name: Joe Thornton MRN: [id number] Date of Birth: 1951-04-10   Condition Code 44 given: Yes   Patient signature on Condition Code 44 notice:   Yes Documentation of 2 MD's agreement:   Yes Code 44 added to claim: Yes      Dessa Phi, RN 01/13/2017, 12:09 PM

## 2017-01-13 NOTE — Care Management CC44 (Signed)
Condition Code 44 Documentation Completed  Patient Details  Name: Joe Thornton MRN: [id number] Date of Birth: [birth year]   Condition Code 44 given:    Patient signature on Condition Code 44 notice:    Documentation of 2 MD's agreement:    Code 44 added to claim:       Dessa Phi, RN 01/13/2017, 12:10 PM

## 2017-01-13 NOTE — Progress Notes (Signed)
UROLOGY PROGRESS NOTES  Assessment/Plan: Joe Thornton is a 66 y.o. male with a history of OA, asthma, DM, GERD, nephrolithiasis, HTN, prostate cancer who is s/p RALP 01/12/17 with Dr. Alinda Money.   Interval/Plan: AFVSS. 1.6L UOP. 105 total from JP since surgery. Hb 11.6.   - Suppos x 1 - Dc JP - OOB/ambulate multiple times this morning - Continue clears - Saline lock - Likely discharge later today   Subjective: No n/v. Ambulating. Passing flatus. Pain controlled.   Objective:  Vital signs in last 24 hours: Temp:  [97.5 F (36.4 C)-98.4 F (36.9 C)] 98.4 F (36.9 C) (04/13 0532) Pulse Rate:  [60-80] 61 (04/13 0532) Resp:  [11-18] 18 (04/13 0532) BP: (107-136)/(53-93) 107/53 (04/13 0532) SpO2:  [96 %-100 %] 96 % (04/13 0532)  04/12 0701 - 04/13 0700 In: 3160 [I.V.:2080; IV Piggyback:1050] Out: 2035 [Urine:1590; Drains:105; Blood:100]    Physical Exam:  General:  well-developed and well-nourished male in NAD, lying in bed, alert & oriented, pleasant HEENT: [place]/AT, EOMI, sclera anicteric, hearing grossly intact, no nasal discharge, MMM Respiratory: nonlabored respirations, satting well on RA, symmetrical chest rise Cardiovascular: pulse regular rate & rhythm Abdominal: soft, NTTP, nondistended, surgical incisions c/d/i without signs of exudate/erythema GU: Foley draining clear yellow urine, JP drain with SS drainage Extremities: warm, well-perfused, no c/c/e Neuro: no focal deficits   Data Review: Results for orders placed or performed during the hospital encounter of 01/12/17 (from the past 24 hour(s))  Glucose, capillary     Status: Abnormal   Collection Time: 01/12/17 11:19 AM  Result Value Ref Range   Glucose-Capillary 119 (H) 65 - 99 mg/dL  Hemoglobin and hematocrit, blood     Status: None   Collection Time: 01/12/17 11:46 AM  Result Value Ref Range   Hemoglobin 13.0 13.0 - 17.0 g/dL   HCT 39.2 39.0 - 52.0 %  Glucose, capillary     Status: Abnormal   Collection Time: 01/12/17  5:26 PM  Result Value Ref Range   Glucose-Capillary 252 (H) 65 - 99 mg/dL  Glucose, capillary     Status: None   Collection Time: 01/12/17  9:04 PM  Result Value Ref Range   Glucose-Capillary 96 65 - 99 mg/dL  Hemoglobin and hematocrit, blood     Status: Abnormal   Collection Time: 01/13/17  5:15 AM  Result Value Ref Range   Hemoglobin 11.6 (L) 13.0 - 17.0 g/dL   HCT 35.5 (L) 39.0 - 52.0 %  Glucose, capillary     Status: Abnormal   Collection Time: 01/13/17  7:34 AM  Result Value Ref Range   Glucose-Capillary 102 (H) 65 - 99 mg/dL    Imaging: None

## 2017-01-30 DIAGNOSIS — Z1389 Encounter for screening for other disorder: Secondary | ICD-10-CM | POA: Diagnosis not present

## 2017-01-30 DIAGNOSIS — I10 Essential (primary) hypertension: Secondary | ICD-10-CM | POA: Diagnosis not present

## 2017-01-30 DIAGNOSIS — C61 Malignant neoplasm of prostate: Secondary | ICD-10-CM | POA: Diagnosis not present

## 2017-01-30 DIAGNOSIS — E1165 Type 2 diabetes mellitus with hyperglycemia: Secondary | ICD-10-CM | POA: Diagnosis not present

## 2017-01-30 DIAGNOSIS — E7800 Pure hypercholesterolemia, unspecified: Secondary | ICD-10-CM | POA: Diagnosis not present

## 2017-02-03 DIAGNOSIS — I700 Atherosclerosis of aorta: Secondary | ICD-10-CM | POA: Diagnosis not present

## 2017-02-03 DIAGNOSIS — R911 Solitary pulmonary nodule: Secondary | ICD-10-CM | POA: Diagnosis not present

## 2017-02-09 DIAGNOSIS — M6281 Muscle weakness (generalized): Secondary | ICD-10-CM | POA: Diagnosis not present

## 2017-04-14 DIAGNOSIS — C61 Malignant neoplasm of prostate: Secondary | ICD-10-CM | POA: Diagnosis not present

## 2017-06-08 DIAGNOSIS — E1165 Type 2 diabetes mellitus with hyperglycemia: Secondary | ICD-10-CM | POA: Diagnosis not present

## 2017-06-08 DIAGNOSIS — J309 Allergic rhinitis, unspecified: Secondary | ICD-10-CM | POA: Diagnosis not present

## 2017-06-08 DIAGNOSIS — E7800 Pure hypercholesterolemia, unspecified: Secondary | ICD-10-CM | POA: Diagnosis not present

## 2017-06-08 DIAGNOSIS — Z9181 History of falling: Secondary | ICD-10-CM | POA: Diagnosis not present

## 2017-06-08 DIAGNOSIS — I10 Essential (primary) hypertension: Secondary | ICD-10-CM | POA: Diagnosis not present

## 2017-07-06 ENCOUNTER — Emergency Department (HOSPITAL_COMMUNITY): Payer: No Typology Code available for payment source

## 2017-07-06 ENCOUNTER — Encounter (HOSPITAL_COMMUNITY): Payer: Self-pay

## 2017-07-06 ENCOUNTER — Emergency Department (HOSPITAL_COMMUNITY)
Admission: EM | Admit: 2017-07-06 | Discharge: 2017-07-06 | Disposition: A | Discharge status: Home / Self Care | Payer: No Typology Code available for payment source | Attending: Emergency Medicine | Admitting: Emergency Medicine

## 2017-07-06 DIAGNOSIS — E119 Type 2 diabetes mellitus without complications: Secondary | ICD-10-CM | POA: Diagnosis not present

## 2017-07-06 DIAGNOSIS — Y939 Activity, unspecified: Secondary | ICD-10-CM | POA: Insufficient documentation

## 2017-07-06 DIAGNOSIS — Z79899 Other long term (current) drug therapy: Secondary | ICD-10-CM | POA: Diagnosis not present

## 2017-07-06 DIAGNOSIS — Y929 Unspecified place or not applicable: Secondary | ICD-10-CM | POA: Insufficient documentation

## 2017-07-06 DIAGNOSIS — S161XXA Strain of muscle, fascia and tendon at neck level, initial encounter: Secondary | ICD-10-CM | POA: Diagnosis not present

## 2017-07-06 DIAGNOSIS — M542 Cervicalgia: Secondary | ICD-10-CM | POA: Diagnosis not present

## 2017-07-06 DIAGNOSIS — Z87891 Personal history of nicotine dependence: Secondary | ICD-10-CM | POA: Insufficient documentation

## 2017-07-06 DIAGNOSIS — J45909 Unspecified asthma, uncomplicated: Secondary | ICD-10-CM | POA: Insufficient documentation

## 2017-07-06 DIAGNOSIS — Z8546 Personal history of malignant neoplasm of prostate: Secondary | ICD-10-CM | POA: Insufficient documentation

## 2017-07-06 DIAGNOSIS — Z794 Long term (current) use of insulin: Secondary | ICD-10-CM | POA: Insufficient documentation

## 2017-07-06 DIAGNOSIS — S299XXA Unspecified injury of thorax, initial encounter: Secondary | ICD-10-CM | POA: Diagnosis not present

## 2017-07-06 DIAGNOSIS — R51 Headache: Secondary | ICD-10-CM | POA: Diagnosis not present

## 2017-07-06 DIAGNOSIS — Y999 Unspecified external cause status: Secondary | ICD-10-CM | POA: Insufficient documentation

## 2017-07-06 DIAGNOSIS — M546 Pain in thoracic spine: Secondary | ICD-10-CM | POA: Diagnosis not present

## 2017-07-06 DIAGNOSIS — I10 Essential (primary) hypertension: Secondary | ICD-10-CM | POA: Diagnosis not present

## 2017-07-06 DIAGNOSIS — R0781 Pleurodynia: Secondary | ICD-10-CM | POA: Diagnosis not present

## 2017-07-06 DIAGNOSIS — S0181XA Laceration without foreign body of other part of head, initial encounter: Secondary | ICD-10-CM | POA: Diagnosis not present

## 2017-07-06 DIAGNOSIS — S0990XA Unspecified injury of head, initial encounter: Secondary | ICD-10-CM | POA: Diagnosis not present

## 2017-07-06 DIAGNOSIS — S199XXA Unspecified injury of neck, initial encounter: Secondary | ICD-10-CM | POA: Diagnosis not present

## 2017-07-06 DIAGNOSIS — S098XXA Other specified injuries of head, initial encounter: Secondary | ICD-10-CM | POA: Diagnosis not present

## 2017-07-06 LAB — COMPREHENSIVE METABOLIC PANEL
ALK PHOS: 45 U/L (ref 38–126)
ALT: 22 U/L (ref 17–63)
ANION GAP: 9 (ref 5–15)
AST: 24 U/L (ref 15–41)
Albumin: 3.7 g/dL (ref 3.5–5.0)
BUN: 13 mg/dL (ref 6–20)
CO2: 24 mmol/L (ref 22–32)
CREATININE: 1.34 mg/dL — AB (ref 0.61–1.24)
Calcium: 9.4 mg/dL (ref 8.9–10.3)
Chloride: 105 mmol/L (ref 101–111)
GFR, EST NON AFRICAN AMERICAN: 53 mL/min — AB (ref >60)
GLUCOSE: 106 mg/dL — AB (ref 65–99)
POTASSIUM: 3.3 mmol/L — AB (ref 3.5–5.1)
SODIUM: 138 mmol/L (ref 135–145)
TOTAL PROTEIN: 6.9 g/dL (ref 6.5–8.1)
Total Bilirubin: 0.8 mg/dL (ref 0.3–1.2)

## 2017-07-06 LAB — CBC WITH DIFFERENTIAL/PLATELET
Basophils Absolute: 0 10*3/uL (ref 0.0–0.1)
Basophils Relative: 0 %
EOS ABS: 0.2 10*3/uL (ref 0.0–0.7)
EOS PCT: 3 %
HCT: 40.1 % (ref 39.0–52.0)
HEMOGLOBIN: 12.9 g/dL — AB (ref 13.0–17.0)
LYMPHS ABS: 1.4 10*3/uL (ref 0.7–4.0)
LYMPHS PCT: 23 %
MCH: 28.2 pg (ref 26.0–34.0)
MCHC: 32.2 g/dL (ref 30.0–36.0)
MCV: 87.7 fL (ref 78.0–100.0)
MONOS PCT: 8 %
Monocytes Absolute: 0.5 10*3/uL (ref 0.1–1.0)
Neutro Abs: 4.2 10*3/uL (ref 1.7–7.7)
Neutrophils Relative %: 66 %
PLATELETS: 176 10*3/uL (ref 150–400)
RBC: 4.57 MIL/uL (ref 4.22–5.81)
RDW: 14 % (ref 11.5–15.5)
WBC: 6.4 10*3/uL (ref 4.0–10.5)

## 2017-07-06 LAB — I-STAT TROPONIN, ED: TROPONIN I, POC: 0 ng/mL (ref 0.00–0.08)

## 2017-07-06 MED ORDER — HYDROCODONE-ACETAMINOPHEN 5-325 MG PO TABS
1.0000 | ORAL_TABLET | Freq: Once | ORAL | Status: AC
  Administered 2017-07-06: 1 via ORAL
  Filled 2017-07-06: qty 1

## 2017-07-06 MED ORDER — HYDROCODONE-ACETAMINOPHEN 5-325 MG PO TABS: 1.0000 | ORAL_TABLET | Freq: Four times a day (QID) | ORAL | 0 refills | Status: AC | PRN

## 2017-07-06 NOTE — Discharge Instructions (Signed)
Return here for any worsening in your condition.  Follow-up with your doctor for recheck.  Use ice and heat on the areas that are sore
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
## 2017-07-08 DIAGNOSIS — M549 Dorsalgia, unspecified: Secondary | ICD-10-CM | POA: Diagnosis not present

## 2017-07-08 DIAGNOSIS — M542 Cervicalgia: Secondary | ICD-10-CM | POA: Diagnosis not present

## 2017-07-08 DIAGNOSIS — R51 Headache: Secondary | ICD-10-CM | POA: Diagnosis not present

## 2017-07-27 DIAGNOSIS — S134XXA Sprain of ligaments of cervical spine, initial encounter: Secondary | ICD-10-CM | POA: Diagnosis not present

## 2017-07-27 DIAGNOSIS — S060X9A Concussion with loss of consciousness of unspecified duration, initial encounter: Secondary | ICD-10-CM | POA: Diagnosis not present

## 2017-08-22 DIAGNOSIS — Z23 Encounter for immunization: Secondary | ICD-10-CM | POA: Diagnosis not present

## 2017-08-22 DIAGNOSIS — S060X9S Concussion with loss of consciousness of unspecified duration, sequela: Secondary | ICD-10-CM | POA: Diagnosis not present

## 2017-08-22 DIAGNOSIS — I10 Essential (primary) hypertension: Secondary | ICD-10-CM | POA: Diagnosis not present

## 2017-08-22 DIAGNOSIS — E1165 Type 2 diabetes mellitus with hyperglycemia: Secondary | ICD-10-CM | POA: Diagnosis not present

## 2017-08-31 DIAGNOSIS — Z0000 Encounter for general adult medical examination without abnormal findings: Secondary | ICD-10-CM | POA: Diagnosis not present

## 2017-08-31 DIAGNOSIS — Z139 Encounter for screening, unspecified: Secondary | ICD-10-CM | POA: Diagnosis not present

## 2017-08-31 DIAGNOSIS — Z136 Encounter for screening for cardiovascular disorders: Secondary | ICD-10-CM | POA: Diagnosis not present

## 2017-08-31 DIAGNOSIS — E785 Hyperlipidemia, unspecified: Secondary | ICD-10-CM | POA: Diagnosis not present

## 2017-08-31 DIAGNOSIS — Z1331 Encounter for screening for depression: Secondary | ICD-10-CM | POA: Diagnosis not present

## 2017-08-31 DIAGNOSIS — Z125 Encounter for screening for malignant neoplasm of prostate: Secondary | ICD-10-CM | POA: Diagnosis not present

## 2017-08-31 DIAGNOSIS — Z9181 History of falling: Secondary | ICD-10-CM | POA: Diagnosis not present
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
## 2018-10-15 DIAGNOSIS — G4700 Insomnia, unspecified: Secondary | ICD-10-CM | POA: Diagnosis not present

## 2018-10-15 DIAGNOSIS — E7800 Pure hypercholesterolemia, unspecified: Secondary | ICD-10-CM | POA: Diagnosis not present

## 2018-10-15 DIAGNOSIS — Z6831 Body mass index (BMI) 31.0-31.9, adult: Secondary | ICD-10-CM | POA: Diagnosis not present

## 2018-10-15 DIAGNOSIS — I10 Essential (primary) hypertension: Secondary | ICD-10-CM | POA: Diagnosis not present

## 2018-10-15 DIAGNOSIS — Z1339 Encounter for screening examination for other mental health and behavioral disorders: Secondary | ICD-10-CM | POA: Diagnosis not present

## 2018-10-15 DIAGNOSIS — E1140 Type 2 diabetes mellitus with diabetic neuropathy, unspecified: Secondary | ICD-10-CM | POA: Diagnosis not present

## 2018-12-07 DIAGNOSIS — R3121 Asymptomatic microscopic hematuria: Secondary | ICD-10-CM | POA: Diagnosis not present

## 2018-12-07 DIAGNOSIS — C61 Malignant neoplasm of prostate: Secondary | ICD-10-CM | POA: Diagnosis not present

## 2018-12-13 DIAGNOSIS — J019 Acute sinusitis, unspecified: Secondary | ICD-10-CM | POA: Diagnosis not present

## 2018-12-13 DIAGNOSIS — Z6831 Body mass index (BMI) 31.0-31.9, adult: Secondary | ICD-10-CM | POA: Diagnosis not present

## 2019-02-21 DIAGNOSIS — E1140 Type 2 diabetes mellitus with diabetic neuropathy, unspecified: Secondary | ICD-10-CM | POA: Diagnosis not present

## 2019-02-21 DIAGNOSIS — C61 Malignant neoplasm of prostate: Secondary | ICD-10-CM | POA: Diagnosis not present

## 2019-02-21 DIAGNOSIS — Z6832 Body mass index (BMI) 32.0-32.9, adult: Secondary | ICD-10-CM | POA: Diagnosis not present

## 2019-02-21 DIAGNOSIS — I10 Essential (primary) hypertension: Secondary | ICD-10-CM | POA: Diagnosis not present

## 2019-02-21 DIAGNOSIS — Z1331 Encounter for screening for depression: Secondary | ICD-10-CM | POA: Diagnosis not present

## 2019-02-21 DIAGNOSIS — E7800 Pure hypercholesterolemia, unspecified: Secondary | ICD-10-CM | POA: Diagnosis not present

## 2019-06-26 DIAGNOSIS — Z6832 Body mass index (BMI) 32.0-32.9, adult: Secondary | ICD-10-CM | POA: Diagnosis not present

## 2019-06-26 DIAGNOSIS — C61 Malignant neoplasm of prostate: Secondary | ICD-10-CM | POA: Diagnosis not present

## 2019-06-26 DIAGNOSIS — E1140 Type 2 diabetes mellitus with diabetic neuropathy, unspecified: Secondary | ICD-10-CM | POA: Diagnosis not present

## 2019-06-26 DIAGNOSIS — Z23 Encounter for immunization: Secondary | ICD-10-CM | POA: Diagnosis not present

## 2019-06-26 DIAGNOSIS — E7800 Pure hypercholesterolemia, unspecified: Secondary | ICD-10-CM | POA: Diagnosis not present

## 2019-06-26 DIAGNOSIS — I10 Essential (primary) hypertension: Secondary | ICD-10-CM | POA: Diagnosis not present

## 2019-07-21 DIAGNOSIS — R42 Dizziness and giddiness: Secondary | ICD-10-CM | POA: Diagnosis not present

## 2019-07-21 DIAGNOSIS — Z87891 Personal history of nicotine dependence: Secondary | ICD-10-CM | POA: Diagnosis not present

## 2019-07-21 DIAGNOSIS — E785 Hyperlipidemia, unspecified: Secondary | ICD-10-CM | POA: Diagnosis not present

## 2019-07-21 DIAGNOSIS — E119 Type 2 diabetes mellitus without complications: Secondary | ICD-10-CM | POA: Diagnosis not present

## 2019-07-21 DIAGNOSIS — I10 Essential (primary) hypertension: Secondary | ICD-10-CM | POA: Diagnosis not present

## 2019-07-26 DIAGNOSIS — R3121 Asymptomatic microscopic hematuria: Secondary | ICD-10-CM | POA: Diagnosis not present

## 2019-07-26 DIAGNOSIS — C61 Malignant neoplasm of prostate: Secondary | ICD-10-CM | POA: Diagnosis not present

## 2019-09-30 DIAGNOSIS — Z9181 History of falling: Secondary | ICD-10-CM | POA: Diagnosis not present

## 2019-09-30 DIAGNOSIS — Z1331 Encounter for screening for depression: Secondary | ICD-10-CM | POA: Diagnosis not present

## 2019-09-30 DIAGNOSIS — E785 Hyperlipidemia, unspecified: Secondary | ICD-10-CM | POA: Diagnosis not present

## 2019-09-30 DIAGNOSIS — Z6832 Body mass index (BMI) 32.0-32.9, adult: Secondary | ICD-10-CM | POA: Diagnosis not present

## 2019-09-30 DIAGNOSIS — Z139 Encounter for screening, unspecified: Secondary | ICD-10-CM | POA: Diagnosis not present

## 2019-09-30 DIAGNOSIS — Z0000 Encounter for general adult medical examination without abnormal findings: Secondary | ICD-10-CM | POA: Diagnosis not present

## 2019-09-30 DIAGNOSIS — Z125 Encounter for screening for malignant neoplasm of prostate: Secondary | ICD-10-CM | POA: Diagnosis not present

## 2019-10-28 DIAGNOSIS — E7800 Pure hypercholesterolemia, unspecified: Secondary | ICD-10-CM | POA: Diagnosis not present

## 2019-10-28 DIAGNOSIS — E1140 Type 2 diabetes mellitus with diabetic neuropathy, unspecified: Secondary | ICD-10-CM | POA: Diagnosis not present

## 2019-10-28 DIAGNOSIS — Z6832 Body mass index (BMI) 32.0-32.9, adult: Secondary | ICD-10-CM | POA: Diagnosis not present

## 2019-10-28 DIAGNOSIS — I10 Essential (primary) hypertension: Secondary | ICD-10-CM | POA: Diagnosis not present

## 2019-10-28 DIAGNOSIS — C61 Malignant neoplasm of prostate: Secondary | ICD-10-CM | POA: Diagnosis not present

## 2020-01-09 DIAGNOSIS — H547 Unspecified visual loss: Secondary | ICD-10-CM | POA: Diagnosis not present

## 2020-01-09 DIAGNOSIS — E785 Hyperlipidemia, unspecified: Secondary | ICD-10-CM | POA: Diagnosis not present

## 2020-01-09 DIAGNOSIS — M170 Bilateral primary osteoarthritis of knee: Secondary | ICD-10-CM | POA: Diagnosis not present

## 2020-01-09 DIAGNOSIS — K219 Gastro-esophageal reflux disease without esophagitis: Secondary | ICD-10-CM | POA: Diagnosis not present

## 2020-01-09 DIAGNOSIS — E1142 Type 2 diabetes mellitus with diabetic polyneuropathy: Secondary | ICD-10-CM | POA: Diagnosis not present

## 2020-01-09 DIAGNOSIS — Z6831 Body mass index (BMI) 31.0-31.9, adult: Secondary | ICD-10-CM | POA: Diagnosis not present

## 2020-01-09 DIAGNOSIS — I10 Essential (primary) hypertension: Secondary | ICD-10-CM | POA: Diagnosis not present

## 2020-01-09 DIAGNOSIS — E669 Obesity, unspecified: Secondary | ICD-10-CM | POA: Diagnosis not present

## 2020-01-09 DIAGNOSIS — G4700 Insomnia, unspecified: Secondary | ICD-10-CM | POA: Diagnosis not present

## 2020-01-24 DIAGNOSIS — R3121 Asymptomatic microscopic hematuria: Secondary | ICD-10-CM | POA: Diagnosis not present

## 2020-01-24 DIAGNOSIS — C61 Malignant neoplasm of prostate: Secondary | ICD-10-CM | POA: Diagnosis not present

## 2020-01-27 DIAGNOSIS — E1165 Type 2 diabetes mellitus with hyperglycemia: Secondary | ICD-10-CM | POA: Diagnosis not present

## 2020-01-27 DIAGNOSIS — I10 Essential (primary) hypertension: Secondary | ICD-10-CM | POA: Diagnosis not present

## 2020-01-27 DIAGNOSIS — Z6832 Body mass index (BMI) 32.0-32.9, adult: Secondary | ICD-10-CM | POA: Diagnosis not present

## 2020-01-27 DIAGNOSIS — Z1211 Encounter for screening for malignant neoplasm of colon: Secondary | ICD-10-CM | POA: Diagnosis not present

## 2020-01-27 DIAGNOSIS — R110 Nausea: Secondary | ICD-10-CM | POA: Diagnosis not present

## 2020-01-27 DIAGNOSIS — E7800 Pure hypercholesterolemia, unspecified: Secondary | ICD-10-CM | POA: Diagnosis not present

## 2020-01-27 DIAGNOSIS — R42 Dizziness and giddiness: Secondary | ICD-10-CM | POA: Diagnosis not present

## 2020-01-30 DIAGNOSIS — R2681 Unsteadiness on feet: Secondary | ICD-10-CM | POA: Diagnosis not present

## 2020-01-30 DIAGNOSIS — H81391 Other peripheral vertigo, right ear: Secondary | ICD-10-CM | POA: Diagnosis not present

## 2020-02-05 DIAGNOSIS — Z1211 Encounter for screening for malignant neoplasm of colon: Secondary | ICD-10-CM | POA: Diagnosis not present

## 2020-02-05 DIAGNOSIS — Z1212 Encounter for screening for malignant neoplasm of rectum: Secondary | ICD-10-CM | POA: Diagnosis not present

## 2020-05-08 DIAGNOSIS — E7800 Pure hypercholesterolemia, unspecified: Secondary | ICD-10-CM | POA: Diagnosis not present

## 2020-05-08 DIAGNOSIS — I10 Essential (primary) hypertension: Secondary | ICD-10-CM | POA: Diagnosis not present

## 2020-05-08 DIAGNOSIS — E1140 Type 2 diabetes mellitus with diabetic neuropathy, unspecified: Secondary | ICD-10-CM | POA: Diagnosis not present

## 2020-05-08 DIAGNOSIS — Z6831 Body mass index (BMI) 31.0-31.9, adult: Secondary | ICD-10-CM | POA: Diagnosis not present

## 2020-08-07 DIAGNOSIS — C61 Malignant neoplasm of prostate: Secondary | ICD-10-CM | POA: Diagnosis not present

## 2020-08-07 DIAGNOSIS — R3121 Asymptomatic microscopic hematuria: Secondary | ICD-10-CM | POA: Diagnosis not present

## 2020-08-10 DIAGNOSIS — Z6831 Body mass index (BMI) 31.0-31.9, adult: Secondary | ICD-10-CM | POA: Diagnosis not present

## 2020-08-10 DIAGNOSIS — E7800 Pure hypercholesterolemia, unspecified: Secondary | ICD-10-CM | POA: Diagnosis not present

## 2020-08-10 DIAGNOSIS — Z23 Encounter for immunization: Secondary | ICD-10-CM | POA: Diagnosis not present

## 2020-08-10 DIAGNOSIS — K219 Gastro-esophageal reflux disease without esophagitis: Secondary | ICD-10-CM | POA: Diagnosis not present

## 2020-08-10 DIAGNOSIS — I10 Essential (primary) hypertension: Secondary | ICD-10-CM | POA: Diagnosis not present

## 2020-08-10 DIAGNOSIS — E1140 Type 2 diabetes mellitus with diabetic neuropathy, unspecified: Secondary | ICD-10-CM | POA: Diagnosis not present

## 2020-08-10 DIAGNOSIS — Z79899 Other long term (current) drug therapy: Secondary | ICD-10-CM | POA: Diagnosis not present

## 2020-08-15 DIAGNOSIS — S61211A Laceration without foreign body of left index finger without damage to nail, initial encounter: Secondary | ICD-10-CM | POA: Diagnosis not present

## 2020-08-15 DIAGNOSIS — M79642 Pain in left hand: Secondary | ICD-10-CM | POA: Diagnosis not present

## 2020-10-01 DIAGNOSIS — Z0000 Encounter for general adult medical examination without abnormal findings: Secondary | ICD-10-CM | POA: Diagnosis not present

## 2020-10-01 DIAGNOSIS — Z139 Encounter for screening, unspecified: Secondary | ICD-10-CM | POA: Diagnosis not present

## 2020-10-01 DIAGNOSIS — Z1331 Encounter for screening for depression: Secondary | ICD-10-CM | POA: Diagnosis not present

## 2020-10-01 DIAGNOSIS — E669 Obesity, unspecified: Secondary | ICD-10-CM | POA: Diagnosis not present

## 2020-10-01 DIAGNOSIS — E785 Hyperlipidemia, unspecified: Secondary | ICD-10-CM | POA: Diagnosis not present

## 2020-10-01 DIAGNOSIS — Z9181 History of falling: Secondary | ICD-10-CM | POA: Diagnosis not present

## 2021-10-07 DIAGNOSIS — Z9181 History of falling: Secondary | ICD-10-CM | POA: Diagnosis not present

## 2021-10-07 DIAGNOSIS — E669 Obesity, unspecified: Secondary | ICD-10-CM | POA: Diagnosis not present

## 2021-10-07 DIAGNOSIS — Z0000 Encounter for general adult medical examination without abnormal findings: Secondary | ICD-10-CM | POA: Diagnosis not present

## 2021-10-07 DIAGNOSIS — Z139 Encounter for screening, unspecified: Secondary | ICD-10-CM | POA: Diagnosis not present

## 2021-10-07 DIAGNOSIS — E785 Hyperlipidemia, unspecified: Secondary | ICD-10-CM | POA: Diagnosis not present

## 2021-10-07 DIAGNOSIS — Z1331 Encounter for screening for depression: Secondary | ICD-10-CM | POA: Diagnosis not present

## 2021-10-07 DIAGNOSIS — Z6831 Body mass index (BMI) 31.0-31.9, adult: Secondary | ICD-10-CM | POA: Diagnosis not present

## 2021-11-02 DIAGNOSIS — R9721 Rising PSA following treatment for malignant neoplasm of prostate: Secondary | ICD-10-CM | POA: Diagnosis not present

## 2021-11-02 DIAGNOSIS — R3121 Asymptomatic microscopic hematuria: Secondary | ICD-10-CM | POA: Diagnosis not present

## 2021-11-02 DIAGNOSIS — C61 Malignant neoplasm of prostate: Secondary | ICD-10-CM | POA: Diagnosis not present

## 2021-11-08 DIAGNOSIS — R3121 Asymptomatic microscopic hematuria: Secondary | ICD-10-CM | POA: Diagnosis not present

## 2021-11-24 DIAGNOSIS — K219 Gastro-esophageal reflux disease without esophagitis: Secondary | ICD-10-CM | POA: Diagnosis not present

## 2021-11-24 DIAGNOSIS — Z6831 Body mass index (BMI) 31.0-31.9, adult: Secondary | ICD-10-CM | POA: Diagnosis not present

## 2021-11-24 DIAGNOSIS — E1140 Type 2 diabetes mellitus with diabetic neuropathy, unspecified: Secondary | ICD-10-CM | POA: Diagnosis not present

## 2021-11-24 DIAGNOSIS — I10 Essential (primary) hypertension: Secondary | ICD-10-CM | POA: Diagnosis not present

## 2021-11-24 DIAGNOSIS — N183 Chronic kidney disease, stage 3 unspecified: Secondary | ICD-10-CM | POA: Diagnosis not present

## 2021-11-24 DIAGNOSIS — E7800 Pure hypercholesterolemia, unspecified: Secondary | ICD-10-CM | POA: Diagnosis not present

## 2022-02-17 DIAGNOSIS — Z20822 Contact with and (suspected) exposure to covid-19: Secondary | ICD-10-CM | POA: Diagnosis not present

## 2022-02-17 DIAGNOSIS — J019 Acute sinusitis, unspecified: Secondary | ICD-10-CM | POA: Diagnosis not present

## 2022-03-01 DIAGNOSIS — E1140 Type 2 diabetes mellitus with diabetic neuropathy, unspecified: Secondary | ICD-10-CM | POA: Diagnosis not present

## 2022-03-01 DIAGNOSIS — I10 Essential (primary) hypertension: Secondary | ICD-10-CM | POA: Diagnosis not present

## 2022-03-01 DIAGNOSIS — K219 Gastro-esophageal reflux disease without esophagitis: Secondary | ICD-10-CM | POA: Diagnosis not present

## 2022-03-01 DIAGNOSIS — E7800 Pure hypercholesterolemia, unspecified: Secondary | ICD-10-CM | POA: Diagnosis not present

## 2022-03-01 DIAGNOSIS — R059 Cough, unspecified: Secondary | ICD-10-CM | POA: Diagnosis not present

## 2022-03-01 DIAGNOSIS — N183 Chronic kidney disease, stage 3 unspecified: Secondary | ICD-10-CM | POA: Diagnosis not present

## 2022-05-06 DIAGNOSIS — C61 Malignant neoplasm of prostate: Secondary | ICD-10-CM | POA: Diagnosis not present

## 2022-05-06 DIAGNOSIS — R3121 Asymptomatic microscopic hematuria: Secondary | ICD-10-CM | POA: Diagnosis not present

## 2022-06-01 DIAGNOSIS — E7800 Pure hypercholesterolemia, unspecified: Secondary | ICD-10-CM | POA: Diagnosis not present

## 2022-06-01 DIAGNOSIS — K219 Gastro-esophageal reflux disease without esophagitis: Secondary | ICD-10-CM | POA: Diagnosis not present

## 2022-06-01 DIAGNOSIS — N183 Chronic kidney disease, stage 3 unspecified: Secondary | ICD-10-CM | POA: Diagnosis not present

## 2022-06-01 DIAGNOSIS — E1140 Type 2 diabetes mellitus with diabetic neuropathy, unspecified: Secondary | ICD-10-CM | POA: Diagnosis not present

## 2022-06-01 DIAGNOSIS — I10 Essential (primary) hypertension: Secondary | ICD-10-CM | POA: Diagnosis not present

## 2022-06-08 DIAGNOSIS — E1140 Type 2 diabetes mellitus with diabetic neuropathy, unspecified: Secondary | ICD-10-CM | POA: Diagnosis not present

## 2022-06-10 DIAGNOSIS — R319 Hematuria, unspecified: Secondary | ICD-10-CM | POA: Diagnosis not present

## 2022-06-10 DIAGNOSIS — M545 Low back pain, unspecified: Secondary | ICD-10-CM | POA: Diagnosis not present

## 2022-07-09 DIAGNOSIS — E1140 Type 2 diabetes mellitus with diabetic neuropathy, unspecified: Secondary | ICD-10-CM | POA: Diagnosis not present

## 2022-07-20 DIAGNOSIS — N529 Male erectile dysfunction, unspecified: Secondary | ICD-10-CM | POA: Diagnosis not present

## 2022-07-20 DIAGNOSIS — Z794 Long term (current) use of insulin: Secondary | ICD-10-CM | POA: Diagnosis not present

## 2022-07-20 DIAGNOSIS — K219 Gastro-esophageal reflux disease without esophagitis: Secondary | ICD-10-CM | POA: Diagnosis not present

## 2022-07-20 DIAGNOSIS — I129 Hypertensive chronic kidney disease with stage 1 through stage 4 chronic kidney disease, or unspecified chronic kidney disease: Secondary | ICD-10-CM | POA: Diagnosis not present

## 2022-07-20 DIAGNOSIS — E785 Hyperlipidemia, unspecified: Secondary | ICD-10-CM | POA: Diagnosis not present

## 2022-07-20 DIAGNOSIS — N1832 Chronic kidney disease, stage 3b: Secondary | ICD-10-CM | POA: Diagnosis not present

## 2022-07-20 DIAGNOSIS — Z800 Family history of malignant neoplasm of digestive organs: Secondary | ICD-10-CM | POA: Diagnosis not present

## 2022-07-20 DIAGNOSIS — Z7984 Long term (current) use of oral hypoglycemic drugs: Secondary | ICD-10-CM | POA: Diagnosis not present

## 2022-07-20 DIAGNOSIS — R32 Unspecified urinary incontinence: Secondary | ICD-10-CM | POA: Diagnosis not present

## 2022-07-20 DIAGNOSIS — Z7982 Long term (current) use of aspirin: Secondary | ICD-10-CM | POA: Diagnosis not present

## 2022-07-20 DIAGNOSIS — E1122 Type 2 diabetes mellitus with diabetic chronic kidney disease: Secondary | ICD-10-CM | POA: Diagnosis not present

## 2022-07-20 DIAGNOSIS — J309 Allergic rhinitis, unspecified: Secondary | ICD-10-CM | POA: Diagnosis not present

## 2022-08-09 DIAGNOSIS — E1140 Type 2 diabetes mellitus with diabetic neuropathy, unspecified: Secondary | ICD-10-CM | POA: Diagnosis not present

## 2022-09-07 DIAGNOSIS — E7800 Pure hypercholesterolemia, unspecified: Secondary | ICD-10-CM | POA: Diagnosis not present

## 2022-09-07 DIAGNOSIS — Z20822 Contact with and (suspected) exposure to covid-19: Secondary | ICD-10-CM | POA: Diagnosis not present

## 2022-09-07 DIAGNOSIS — I10 Essential (primary) hypertension: Secondary | ICD-10-CM | POA: Diagnosis not present

## 2022-09-07 DIAGNOSIS — E1140 Type 2 diabetes mellitus with diabetic neuropathy, unspecified: Secondary | ICD-10-CM | POA: Diagnosis not present

## 2022-09-07 DIAGNOSIS — K219 Gastro-esophageal reflux disease without esophagitis: Secondary | ICD-10-CM | POA: Diagnosis not present

## 2022-09-07 DIAGNOSIS — N183 Chronic kidney disease, stage 3 unspecified: Secondary | ICD-10-CM | POA: Diagnosis not present

## 2022-09-15 DIAGNOSIS — E1140 Type 2 diabetes mellitus with diabetic neuropathy, unspecified: Secondary | ICD-10-CM | POA: Diagnosis not present

## 2022-10-16 DIAGNOSIS — E1140 Type 2 diabetes mellitus with diabetic neuropathy, unspecified: Secondary | ICD-10-CM | POA: Diagnosis not present

## 2022-11-16 DIAGNOSIS — E1140 Type 2 diabetes mellitus with diabetic neuropathy, unspecified: Secondary | ICD-10-CM | POA: Diagnosis not present

## 2022-11-22 DIAGNOSIS — E7800 Pure hypercholesterolemia, unspecified: Secondary | ICD-10-CM | POA: Diagnosis not present

## 2022-11-22 DIAGNOSIS — M25531 Pain in right wrist: Secondary | ICD-10-CM | POA: Diagnosis not present

## 2022-11-22 DIAGNOSIS — M79641 Pain in right hand: Secondary | ICD-10-CM | POA: Diagnosis not present

## 2022-11-22 DIAGNOSIS — I10 Essential (primary) hypertension: Secondary | ICD-10-CM | POA: Diagnosis not present

## 2022-12-27 DIAGNOSIS — Z139 Encounter for screening, unspecified: Secondary | ICD-10-CM | POA: Diagnosis not present

## 2022-12-27 DIAGNOSIS — I10 Essential (primary) hypertension: Secondary | ICD-10-CM | POA: Diagnosis not present

## 2022-12-27 DIAGNOSIS — Z1331 Encounter for screening for depression: Secondary | ICD-10-CM | POA: Diagnosis not present

## 2022-12-27 DIAGNOSIS — N183 Chronic kidney disease, stage 3 unspecified: Secondary | ICD-10-CM | POA: Diagnosis not present

## 2022-12-27 DIAGNOSIS — E7800 Pure hypercholesterolemia, unspecified: Secondary | ICD-10-CM | POA: Diagnosis not present

## 2022-12-27 DIAGNOSIS — Z9181 History of falling: Secondary | ICD-10-CM | POA: Diagnosis not present

## 2022-12-27 DIAGNOSIS — Z87442 Personal history of urinary calculi: Secondary | ICD-10-CM | POA: Diagnosis not present

## 2022-12-27 DIAGNOSIS — K219 Gastro-esophageal reflux disease without esophagitis: Secondary | ICD-10-CM | POA: Diagnosis not present

## 2022-12-27 DIAGNOSIS — E1129 Type 2 diabetes mellitus with other diabetic kidney complication: Secondary | ICD-10-CM | POA: Diagnosis not present

## 2022-12-27 DIAGNOSIS — R809 Proteinuria, unspecified: Secondary | ICD-10-CM | POA: Diagnosis not present

## 2023-03-13 DIAGNOSIS — N1832 Chronic kidney disease, stage 3b: Secondary | ICD-10-CM | POA: Diagnosis not present

## 2023-03-13 DIAGNOSIS — M545 Low back pain, unspecified: Secondary | ICD-10-CM | POA: Diagnosis not present

## 2023-03-13 DIAGNOSIS — Z794 Long term (current) use of insulin: Secondary | ICD-10-CM | POA: Diagnosis not present

## 2023-03-13 DIAGNOSIS — Z79899 Other long term (current) drug therapy: Secondary | ICD-10-CM | POA: Diagnosis not present

## 2023-03-13 DIAGNOSIS — F1021 Alcohol dependence, in remission: Secondary | ICD-10-CM | POA: Diagnosis not present

## 2023-03-13 DIAGNOSIS — E1142 Type 2 diabetes mellitus with diabetic polyneuropathy: Secondary | ICD-10-CM | POA: Diagnosis not present

## 2023-03-13 DIAGNOSIS — R32 Unspecified urinary incontinence: Secondary | ICD-10-CM | POA: Diagnosis not present

## 2023-03-13 DIAGNOSIS — E1122 Type 2 diabetes mellitus with diabetic chronic kidney disease: Secondary | ICD-10-CM | POA: Diagnosis not present

## 2023-03-13 DIAGNOSIS — J309 Allergic rhinitis, unspecified: Secondary | ICD-10-CM | POA: Diagnosis not present

## 2023-03-13 DIAGNOSIS — Z008 Encounter for other general examination: Secondary | ICD-10-CM | POA: Diagnosis not present

## 2023-03-13 DIAGNOSIS — N529 Male erectile dysfunction, unspecified: Secondary | ICD-10-CM | POA: Diagnosis not present

## 2023-03-13 DIAGNOSIS — K219 Gastro-esophageal reflux disease without esophagitis: Secondary | ICD-10-CM | POA: Diagnosis not present

## 2023-03-13 DIAGNOSIS — E785 Hyperlipidemia, unspecified: Secondary | ICD-10-CM | POA: Diagnosis not present

## 2023-03-29 DIAGNOSIS — K219 Gastro-esophageal reflux disease without esophagitis: Secondary | ICD-10-CM | POA: Diagnosis not present

## 2023-03-29 DIAGNOSIS — R809 Proteinuria, unspecified: Secondary | ICD-10-CM | POA: Diagnosis not present

## 2023-03-29 DIAGNOSIS — I10 Essential (primary) hypertension: Secondary | ICD-10-CM | POA: Diagnosis not present

## 2023-03-29 DIAGNOSIS — E1129 Type 2 diabetes mellitus with other diabetic kidney complication: Secondary | ICD-10-CM | POA: Diagnosis not present

## 2023-03-29 DIAGNOSIS — E7800 Pure hypercholesterolemia, unspecified: Secondary | ICD-10-CM | POA: Diagnosis not present

## 2023-03-29 DIAGNOSIS — N183 Chronic kidney disease, stage 3 unspecified: Secondary | ICD-10-CM | POA: Diagnosis not present

## 2023-03-29 DIAGNOSIS — Z1211 Encounter for screening for malignant neoplasm of colon: Secondary | ICD-10-CM | POA: Diagnosis not present

## 2023-04-04 ENCOUNTER — Telehealth: Payer: Self-pay

## 2023-04-04 NOTE — Patient Outreach (Signed)
  Care Coordination   04/04/2023 Name: Joe Thornton MRN: [id number] DOB: 10-Aug-1951   Care Coordination Outreach Attempts:  An unsuccessful telephone outreach was attempted today to offer the patient information about available care coordination services.  Follow Up Plan:  Additional outreach attempts will be made to offer the patient care coordination information and services.   Encounter Outcome:  No Answer   Care Coordination Interventions:  No, not indicated    Rowe Pavy, RN, BSN, [facility] [employer] [phone]

## 2023-05-05 DIAGNOSIS — Z1211 Encounter for screening for malignant neoplasm of colon: Secondary | ICD-10-CM | POA: Diagnosis not present

## 2023-05-05 DIAGNOSIS — Z1212 Encounter for screening for malignant neoplasm of rectum: Secondary | ICD-10-CM | POA: Diagnosis not present

## 2023-05-23 DIAGNOSIS — U071 COVID-19: Secondary | ICD-10-CM | POA: Diagnosis not present

## 2023-05-23 DIAGNOSIS — E1140 Type 2 diabetes mellitus with diabetic neuropathy, unspecified: Secondary | ICD-10-CM | POA: Diagnosis not present

## 2023-06-12 DIAGNOSIS — H52223 Regular astigmatism, bilateral: Secondary | ICD-10-CM | POA: Diagnosis not present

## 2023-06-12 DIAGNOSIS — H5203 Hypermetropia, bilateral: Secondary | ICD-10-CM | POA: Diagnosis not present

## 2023-06-12 DIAGNOSIS — E119 Type 2 diabetes mellitus without complications: Secondary | ICD-10-CM | POA: Diagnosis not present

## 2023-06-12 DIAGNOSIS — Z794 Long term (current) use of insulin: Secondary | ICD-10-CM | POA: Diagnosis not present

## 2023-06-20 DIAGNOSIS — Z0100 Encounter for examination of eyes and vision without abnormal findings: Secondary | ICD-10-CM | POA: Diagnosis not present

## 2023-07-03 DIAGNOSIS — E7800 Pure hypercholesterolemia, unspecified: Secondary | ICD-10-CM | POA: Diagnosis not present

## 2023-07-03 DIAGNOSIS — Z23 Encounter for immunization: Secondary | ICD-10-CM | POA: Diagnosis not present

## 2023-07-03 DIAGNOSIS — K219 Gastro-esophageal reflux disease without esophagitis: Secondary | ICD-10-CM | POA: Diagnosis not present

## 2023-07-03 DIAGNOSIS — N1832 Chronic kidney disease, stage 3b: Secondary | ICD-10-CM | POA: Diagnosis not present

## 2023-07-03 DIAGNOSIS — E1129 Type 2 diabetes mellitus with other diabetic kidney complication: Secondary | ICD-10-CM | POA: Diagnosis not present

## 2023-07-03 DIAGNOSIS — I10 Essential (primary) hypertension: Secondary | ICD-10-CM | POA: Diagnosis not present

## 2023-07-03 DIAGNOSIS — R809 Proteinuria, unspecified: Secondary | ICD-10-CM | POA: Diagnosis not present

## 2023-07-03 DIAGNOSIS — R221 Localized swelling, mass and lump, neck: Secondary | ICD-10-CM | POA: Diagnosis not present

## 2023-07-12 DIAGNOSIS — Z0000 Encounter for general adult medical examination without abnormal findings: Secondary | ICD-10-CM | POA: Diagnosis not present

## 2023-07-12 DIAGNOSIS — Z139 Encounter for screening, unspecified: Secondary | ICD-10-CM | POA: Diagnosis not present

## 2023-07-12 DIAGNOSIS — Z9181 History of falling: Secondary | ICD-10-CM | POA: Diagnosis not present

## 2023-07-12 DIAGNOSIS — Z1331 Encounter for screening for depression: Secondary | ICD-10-CM | POA: Diagnosis not present

## 2023-09-26 DIAGNOSIS — R3121 Asymptomatic microscopic hematuria: Secondary | ICD-10-CM | POA: Diagnosis not present

## 2023-09-26 DIAGNOSIS — C61 Malignant neoplasm of prostate: Secondary | ICD-10-CM | POA: Diagnosis not present

## 2023-10-10 DIAGNOSIS — E7800 Pure hypercholesterolemia, unspecified: Secondary | ICD-10-CM | POA: Diagnosis not present

## 2023-10-10 DIAGNOSIS — K219 Gastro-esophageal reflux disease without esophagitis: Secondary | ICD-10-CM | POA: Diagnosis not present

## 2023-10-10 DIAGNOSIS — N1832 Chronic kidney disease, stage 3b: Secondary | ICD-10-CM | POA: Diagnosis not present

## 2023-10-10 DIAGNOSIS — E1129 Type 2 diabetes mellitus with other diabetic kidney complication: Secondary | ICD-10-CM | POA: Diagnosis not present

## 2023-10-10 DIAGNOSIS — I10 Essential (primary) hypertension: Secondary | ICD-10-CM | POA: Diagnosis not present

## 2023-10-10 DIAGNOSIS — R809 Proteinuria, unspecified: Secondary | ICD-10-CM | POA: Diagnosis not present

## 2023-12-08 DIAGNOSIS — Z794 Long term (current) use of insulin: Secondary | ICD-10-CM | POA: Diagnosis not present

## 2023-12-08 DIAGNOSIS — E1122 Type 2 diabetes mellitus with diabetic chronic kidney disease: Secondary | ICD-10-CM | POA: Diagnosis not present

## 2023-12-08 DIAGNOSIS — N1832 Chronic kidney disease, stage 3b: Secondary | ICD-10-CM | POA: Diagnosis not present

## 2023-12-08 DIAGNOSIS — F1021 Alcohol dependence, in remission: Secondary | ICD-10-CM | POA: Diagnosis not present

## 2023-12-08 DIAGNOSIS — Z87891 Personal history of nicotine dependence: Secondary | ICD-10-CM | POA: Diagnosis not present

## 2023-12-08 DIAGNOSIS — E1142 Type 2 diabetes mellitus with diabetic polyneuropathy: Secondary | ICD-10-CM | POA: Diagnosis not present

## 2023-12-08 DIAGNOSIS — Z7984 Long term (current) use of oral hypoglycemic drugs: Secondary | ICD-10-CM | POA: Diagnosis not present

## 2023-12-08 DIAGNOSIS — I771 Stricture of artery: Secondary | ICD-10-CM | POA: Diagnosis not present

## 2023-12-08 DIAGNOSIS — Z8249 Family history of ischemic heart disease and other diseases of the circulatory system: Secondary | ICD-10-CM | POA: Diagnosis not present

## 2023-12-08 DIAGNOSIS — J449 Chronic obstructive pulmonary disease, unspecified: Secondary | ICD-10-CM | POA: Diagnosis not present

## 2023-12-08 DIAGNOSIS — Z833 Family history of diabetes mellitus: Secondary | ICD-10-CM | POA: Diagnosis not present

## 2023-12-08 DIAGNOSIS — K219 Gastro-esophageal reflux disease without esophagitis: Secondary | ICD-10-CM | POA: Diagnosis not present

## 2023-12-08 DIAGNOSIS — Z008 Encounter for other general examination: Secondary | ICD-10-CM | POA: Diagnosis not present

## 2024-01-17 DIAGNOSIS — Z87442 Personal history of urinary calculi: Secondary | ICD-10-CM | POA: Diagnosis not present

## 2024-01-17 DIAGNOSIS — E7800 Pure hypercholesterolemia, unspecified: Secondary | ICD-10-CM | POA: Diagnosis not present

## 2024-01-17 DIAGNOSIS — N1832 Chronic kidney disease, stage 3b: Secondary | ICD-10-CM | POA: Diagnosis not present

## 2024-01-17 DIAGNOSIS — R809 Proteinuria, unspecified: Secondary | ICD-10-CM | POA: Diagnosis not present

## 2024-01-17 DIAGNOSIS — E1129 Type 2 diabetes mellitus with other diabetic kidney complication: Secondary | ICD-10-CM | POA: Diagnosis not present

## 2024-01-17 DIAGNOSIS — I10 Essential (primary) hypertension: Secondary | ICD-10-CM | POA: Diagnosis not present

## 2024-01-17 DIAGNOSIS — K219 Gastro-esophageal reflux disease without esophagitis: Secondary | ICD-10-CM | POA: Diagnosis not present

## 2024-04-23 DIAGNOSIS — K219 Gastro-esophageal reflux disease without esophagitis: Secondary | ICD-10-CM | POA: Diagnosis not present

## 2024-04-23 DIAGNOSIS — J309 Allergic rhinitis, unspecified: Secondary | ICD-10-CM | POA: Diagnosis not present

## 2024-04-23 DIAGNOSIS — M25512 Pain in left shoulder: Secondary | ICD-10-CM | POA: Diagnosis not present

## 2024-04-23 DIAGNOSIS — I10 Essential (primary) hypertension: Secondary | ICD-10-CM | POA: Diagnosis not present

## 2024-04-23 DIAGNOSIS — Z8546 Personal history of malignant neoplasm of prostate: Secondary | ICD-10-CM | POA: Diagnosis not present

## 2024-04-23 DIAGNOSIS — E1129 Type 2 diabetes mellitus with other diabetic kidney complication: Secondary | ICD-10-CM | POA: Diagnosis not present

## 2024-04-23 DIAGNOSIS — Z87442 Personal history of urinary calculi: Secondary | ICD-10-CM | POA: Diagnosis not present

## 2024-04-23 DIAGNOSIS — E7800 Pure hypercholesterolemia, unspecified: Secondary | ICD-10-CM | POA: Diagnosis not present

## 2024-04-23 DIAGNOSIS — N1832 Chronic kidney disease, stage 3b: Secondary | ICD-10-CM | POA: Diagnosis not present

## 2024-04-23 DIAGNOSIS — R809 Proteinuria, unspecified: Secondary | ICD-10-CM | POA: Diagnosis not present

## 2024-05-26 DIAGNOSIS — I10 Essential (primary) hypertension: Secondary | ICD-10-CM | POA: Diagnosis not present

## 2024-05-26 DIAGNOSIS — E860 Dehydration: Secondary | ICD-10-CM | POA: Diagnosis not present

## 2024-07-06 DIAGNOSIS — R9431 Abnormal electrocardiogram [ECG] [EKG]: Secondary | ICD-10-CM | POA: Diagnosis not present

## 2024-07-06 DIAGNOSIS — I10 Essential (primary) hypertension: Secondary | ICD-10-CM | POA: Diagnosis not present

## 2024-07-06 DIAGNOSIS — R079 Chest pain, unspecified: Secondary | ICD-10-CM | POA: Diagnosis not present

## 2024-07-06 DIAGNOSIS — R059 Cough, unspecified: Secondary | ICD-10-CM | POA: Diagnosis not present

## 2024-07-06 DIAGNOSIS — J439 Emphysema, unspecified: Secondary | ICD-10-CM | POA: Diagnosis not present

## 2024-07-10 DIAGNOSIS — K219 Gastro-esophageal reflux disease without esophagitis: Secondary | ICD-10-CM | POA: Diagnosis not present

## 2024-07-10 DIAGNOSIS — H6121 Impacted cerumen, right ear: Secondary | ICD-10-CM | POA: Diagnosis not present

## 2024-07-10 DIAGNOSIS — I10 Essential (primary) hypertension: Secondary | ICD-10-CM | POA: Diagnosis not present

## 2024-07-10 DIAGNOSIS — J019 Acute sinusitis, unspecified: Secondary | ICD-10-CM | POA: Diagnosis not present

## 2024-07-10 DIAGNOSIS — R059 Cough, unspecified: Secondary | ICD-10-CM | POA: Diagnosis not present

## 2024-07-10 DIAGNOSIS — J439 Emphysema, unspecified: Secondary | ICD-10-CM | POA: Diagnosis not present

## 2024-07-25 DIAGNOSIS — N1832 Chronic kidney disease, stage 3b: Secondary | ICD-10-CM | POA: Diagnosis not present

## 2024-07-25 DIAGNOSIS — K219 Gastro-esophageal reflux disease without esophagitis: Secondary | ICD-10-CM | POA: Diagnosis not present

## 2024-07-25 DIAGNOSIS — E7800 Pure hypercholesterolemia, unspecified: Secondary | ICD-10-CM | POA: Diagnosis not present

## 2024-07-25 DIAGNOSIS — R809 Proteinuria, unspecified: Secondary | ICD-10-CM | POA: Diagnosis not present

## 2024-07-25 DIAGNOSIS — I10 Essential (primary) hypertension: Secondary | ICD-10-CM | POA: Diagnosis not present

## 2024-07-25 DIAGNOSIS — J309 Allergic rhinitis, unspecified: Secondary | ICD-10-CM | POA: Diagnosis not present

## 2024-07-25 DIAGNOSIS — Z139 Encounter for screening, unspecified: Secondary | ICD-10-CM | POA: Diagnosis not present

## 2024-07-25 DIAGNOSIS — E1129 Type 2 diabetes mellitus with other diabetic kidney complication: Secondary | ICD-10-CM | POA: Diagnosis not present

## 2024-07-25 DIAGNOSIS — Z8546 Personal history of malignant neoplasm of prostate: Secondary | ICD-10-CM | POA: Diagnosis not present

## 2024-07-25 DIAGNOSIS — Z9181 History of falling: Secondary | ICD-10-CM | POA: Diagnosis not present

## 2024-07-25 DIAGNOSIS — Z1331 Encounter for screening for depression: Secondary | ICD-10-CM | POA: Diagnosis not present
